# Patient Record
Sex: Male | Born: 1955
Health system: Southern US, Community
[De-identification: ages and names within clinical notes are randomized; demographics above are authoritative.]

## PROBLEM LIST (undated history)

## (undated) DIAGNOSIS — C4491 Basal cell carcinoma of skin, unspecified: Secondary | ICD-10-CM

## (undated) DIAGNOSIS — M1990 Unspecified osteoarthritis, unspecified site: Secondary | ICD-10-CM

## (undated) DIAGNOSIS — E785 Hyperlipidemia, unspecified: Secondary | ICD-10-CM

## (undated) DIAGNOSIS — I10 Essential (primary) hypertension: Secondary | ICD-10-CM

## (undated) HISTORY — DX: Basal cell carcinoma of skin, unspecified: C44.91

## (undated) HISTORY — DX: Hyperlipidemia, unspecified: E78.5

## (undated) HISTORY — DX: Essential (primary) hypertension: I10

---

## 2012-01-29 DIAGNOSIS — C4491 Basal cell carcinoma of skin, unspecified: Secondary | ICD-10-CM

## 2012-01-29 HISTORY — DX: Basal cell carcinoma of skin, unspecified: C44.91

## 2016-01-08 LAB — PSA: PSA: 0.5

## 2016-02-01 DIAGNOSIS — J309 Allergic rhinitis, unspecified: Secondary | ICD-10-CM | POA: Insufficient documentation

## 2016-02-02 LAB — HEMOGLOBIN A1C: Hemoglobin A1C: 5.4

## 2017-05-12 DIAGNOSIS — Z0000 Encounter for general adult medical examination without abnormal findings: Secondary | ICD-10-CM | POA: Diagnosis not present

## 2017-05-12 DIAGNOSIS — Z6827 Body mass index (BMI) 27.0-27.9, adult: Secondary | ICD-10-CM | POA: Diagnosis not present

## 2017-05-12 DIAGNOSIS — Z13220 Encounter for screening for lipoid disorders: Secondary | ICD-10-CM | POA: Diagnosis not present

## 2017-05-12 DIAGNOSIS — Z1159 Encounter for screening for other viral diseases: Secondary | ICD-10-CM | POA: Diagnosis not present

## 2017-05-13 LAB — HM HEPATITIS C SCREENING LAB: HM Hepatitis Screen: NEGATIVE

## 2018-05-11 DIAGNOSIS — Z0000 Encounter for general adult medical examination without abnormal findings: Secondary | ICD-10-CM | POA: Diagnosis not present

## 2018-05-11 DIAGNOSIS — Z13220 Encounter for screening for lipoid disorders: Secondary | ICD-10-CM | POA: Diagnosis not present

## 2018-05-11 LAB — HEPATIC FUNCTION PANEL
ALT: 23 (ref 10–40)
AST: 25 (ref 14–40)
Alkaline Phosphatase: 81 (ref 25–125)
Bilirubin, Total: 0.9

## 2018-05-11 LAB — BASIC METABOLIC PANEL
BUN: 16 (ref 4–21)
Creatinine: 1.1 (ref 0.6–1.3)
Glucose: 90
Potassium: 4.5 (ref 3.4–5.3)
Sodium: 143 (ref 137–147)

## 2018-05-11 LAB — LIPID PANEL
Cholesterol: 175 (ref 0–200)
HDL: 45 (ref 35–70)
LDL Cholesterol: 114
TRIGLYCERIDES: 83 (ref 40–160)

## 2018-05-18 DIAGNOSIS — Z0000 Encounter for general adult medical examination without abnormal findings: Secondary | ICD-10-CM | POA: Diagnosis not present

## 2018-05-18 DIAGNOSIS — Z6829 Body mass index (BMI) 29.0-29.9, adult: Secondary | ICD-10-CM | POA: Diagnosis not present

## 2018-07-13 DIAGNOSIS — B079 Viral wart, unspecified: Secondary | ICD-10-CM | POA: Diagnosis not present

## 2018-07-13 DIAGNOSIS — C44319 Basal cell carcinoma of skin of other parts of face: Secondary | ICD-10-CM | POA: Diagnosis not present

## 2018-07-13 DIAGNOSIS — L821 Other seborrheic keratosis: Secondary | ICD-10-CM | POA: Diagnosis not present

## 2018-07-13 DIAGNOSIS — D492 Neoplasm of unspecified behavior of bone, soft tissue, and skin: Secondary | ICD-10-CM | POA: Diagnosis not present

## 2018-07-13 DIAGNOSIS — Z85828 Personal history of other malignant neoplasm of skin: Secondary | ICD-10-CM | POA: Diagnosis not present

## 2018-08-04 DIAGNOSIS — D124 Benign neoplasm of descending colon: Secondary | ICD-10-CM | POA: Diagnosis not present

## 2018-08-04 DIAGNOSIS — Z1211 Encounter for screening for malignant neoplasm of colon: Secondary | ICD-10-CM | POA: Diagnosis not present

## 2018-08-04 DIAGNOSIS — D122 Benign neoplasm of ascending colon: Secondary | ICD-10-CM | POA: Diagnosis not present

## 2018-08-11 DIAGNOSIS — C44310 Basal cell carcinoma of skin of unspecified parts of face: Secondary | ICD-10-CM | POA: Diagnosis not present

## 2018-08-11 DIAGNOSIS — L905 Scar conditions and fibrosis of skin: Secondary | ICD-10-CM | POA: Diagnosis not present

## 2018-12-08 ENCOUNTER — Telehealth: Payer: Self-pay | Admitting: Family Medicine

## 2018-12-08 NOTE — Telephone Encounter (Signed)
Wife, [name], called to make a new patient appointment for Husband Thomas Rosario. Their son, [name].O.B- 05/31/98 is an established patient of yours.

## 2018-12-22 ENCOUNTER — Ambulatory Visit (INDEPENDENT_AMBULATORY_CARE_PROVIDER_SITE_OTHER): Payer: BLUE CROSS/BLUE SHIELD | Admitting: Family Medicine

## 2018-12-22 ENCOUNTER — Encounter: Payer: Self-pay | Admitting: Family Medicine

## 2018-12-22 VITALS — BP 131/92 | HR 85 | Ht 69.0 in | Wt 195.0 lb

## 2018-12-22 DIAGNOSIS — Z8249 Family history of ischemic heart disease and other diseases of the circulatory system: Secondary | ICD-10-CM | POA: Diagnosis not present

## 2018-12-22 DIAGNOSIS — E782 Mixed hyperlipidemia: Secondary | ICD-10-CM

## 2018-12-22 DIAGNOSIS — Z23 Encounter for immunization: Secondary | ICD-10-CM

## 2018-12-22 DIAGNOSIS — Z6828 Body mass index (BMI) 28.0-28.9, adult: Secondary | ICD-10-CM

## 2018-12-22 DIAGNOSIS — I10 Essential (primary) hypertension: Secondary | ICD-10-CM

## 2018-12-22 DIAGNOSIS — E785 Hyperlipidemia, unspecified: Secondary | ICD-10-CM | POA: Insufficient documentation

## 2018-12-22 HISTORY — DX: Essential (primary) hypertension: I10

## 2018-12-22 HISTORY — DX: Hyperlipidemia, unspecified: E78.5

## 2018-12-22 NOTE — Patient Instructions (Addendum)
Thank you for coming in today. Try to set calorie goal for 1800 calories a day.  Work on continued exercise.   This will help blood pressure.   We will get fasting labs prior to the physical.   Recombinant Zoster (Shingles) Vaccine, RZV: What You Need to Know 1. Why get vaccinated? Shingles (also called herpes zoster, or just zoster) is a painful skin rash, often with blisters. Shingles is caused by the varicella zoster virus, the same virus that causes chickenpox. After you have chickenpox, the virus stays in your body and can cause shingles later in life. You can't catch shingles from another person. However, a person who has never had chickenpox (or chickenpox vaccine) could get chickenpox from someone with shingles. A shingles rash usually appears on one side of the face or body and heals within 2 to 4 weeks. Its main symptom is pain, which can be severe. Other symptoms can include fever, headache, chills, and upset stomach. Very rarely, a shingles infection can lead to pneumonia, hearing problems, blindness, brain inflammation (encephalitis), or death. For about 1 person in 5, severe pain can continue even long after the rash has cleared up. This long-lasting pain is called post-herpetic neuralgia (PHN). Shingles is far more common in people 15 years of age and older than in younger people, and the risk increases with age. It is also more common in people whose immune system is weakened because of a disease such as cancer, or by drugs such as steroids or chemotherapy. At least 1 million people a year in the [place] get shingles. 2. Shingles vaccine (recombinant) Recombinant shingles vaccine was approved by FDA in 2017 for the prevention of shingles. In clinical trials, it was more than 90% effective in preventing shingles. It can also reduce the likelihood of PHN. Two doses, 2 to 6 months apart, are recommended for adults 79 and older. This vaccine is also recommended for people who  have already gotten the live shingles vaccine (Zostavax). There is no live virus in this vaccine. 3. Some people should not get this vaccine Tell your vaccine provider if you:  Have any severe, life-threatening allergies. A person who has ever had a life-threatening allergic reaction after a dose of recombinant shingles vaccine, or has a severe allergy to any component of this vaccine, may be advised not to be vaccinated. Ask your health care provider if you want information about vaccine components.  Are pregnant or breastfeeding. There is not much information about use of recombinant shingles vaccine in pregnant or nursing women. Your healthcare provider might recommend delaying vaccination.  Are not feeling well. If you have a mild illness, such as a cold, you can probably get the vaccine today. If you are moderately or severely ill, you should probably wait until you recover. Your doctor can advise you. 4. Risks of a vaccine reaction With any medicine, including vaccines, there is a chance of reactions. After recombinant shingles vaccination, a person might experience:  Pain, redness, soreness, or swelling at the site of the injection  Headache, muscle aches, fever, shivering, fatigue In clinical trials, most people got a sore arm with mild or moderate pain after vaccination, and some also had redness and swelling where they got the shot. Some people felt tired, had muscle pain, a headache, shivering, fever, stomach pain, or nausea. About 1 out of 6 people who got recombinant zoster vaccine experienced side effects that prevented them from doing regular activities. Symptoms went away on their own in  about 2 to 3 days. Side effects were more common in younger people. You should still get the second dose of recombinant zoster vaccine even if you had one of these reactions after the first dose. Other things that could happen after this vaccine:  People sometimes faint after medical procedures,  including vaccination. Sitting or lying down for about 15 minutes can help prevent fainting and injuries caused by a fall. Tell your provider if you feel dizzy or have vision changes or ringing in the ears.  Some people get shoulder pain that can be more severe and longer-lasting than routine soreness that can follow injections. This happens very rarely.  Any medication can cause a severe allergic reaction. Such reactions to a vaccine are estimated at about 1 in a million doses, and would happen within a few minutes to a few hours after the vaccination. As with any medicine, there is a very remote chance of a vaccine causing a serious injury or death. The safety of vaccines is always being monitored. For more information, visit: http://www.aguilar.org/ 5. What if there is a serious problem? What should I look for?  Look for anything that concerns you, such as signs of a severe allergic reaction, very high fever, or unusual behavior. Signs of a severe allergic reaction can include hives, swelling of the face and throat, difficulty breathing, a fast heartbeat, dizziness, and weakness. These would usually start a few minutes to a few hours after the vaccination. What should I do?  If you think it is a severe allergic reaction or other emergency that can't wait, call 9-1-1 or get to the nearest hospital. Otherwise, call your health care provider. Afterward, the reaction should be reported to the Vaccine Adverse Event Reporting System (VAERS). Your doctor should file this report, or you can do it yourself through the VAERS website at www.vaers.SamedayNews.es, or by calling [phone]. VAERS does not give medical advice. 6. How can I learn more?  Ask your health care provider. He or she can give you the vaccine package insert or suggest other sources of information.  Call your local or state health department.  Contact the Centers for Disease Control and Prevention (CDC): ? Call 873-691-2743  (1-800-CDC-INFO) or ? Visit CDC's vaccines website at http://hunter.com/ CDC Vaccine Information Statement Recombinant Zoster Vaccine (01/06/2017) This information is not intended to replace advice given to you by your health care provider. Make sure you discuss any questions you have with your health care provider. Document Released: 01/21/2017 Document Revised: 06/17/2018 Document Reviewed: 06/17/2018 Elsevier Interactive Patient Education  2019 [employer].

## 2018-12-22 NOTE — Progress Notes (Signed)
Thomas Rosario is a 63 y.o. male who presents to [place]: [place] today for establish care and discuss blood pressure and lipids.  Patient was previously seen at [facility] however his previous primary care provider has left the practice and is looking for a new provider.  I am the primary care provider for his son.  He denies any acute medical problems and feels pretty well.  He has a medical history significant for basal cell carcinoma on his face status post excisional biopsy in 2019.  He does not take any medications regularly.    He does exercise but does not eat a very careful diet.  He denies any fevers chills nausea vomiting or diarrhea.  His family history is concerning for sudden cardiac death in his father age 39 years old.  He has no other family history of sudden cardiac death or unexplained drowning's for example.   ROS as above: No headache, visual changes, nausea, vomiting, diarrhea, constipation, dizziness, abdominal pain, skin rash, fevers, chills, night sweats, weight loss, swollen lymph nodes, body aches, joint swelling, muscle aches, chest pain, shortness of breath, mood changes, visual or auditory hallucinations.    Exam:  BP (!) 131/92   Pulse 85   Ht 5\' 9"  (1.753 m)   Wt 195 lb (88.5 kg)   BMI 28.80 kg/m  Wt Readings from Last 5 Encounters:  12/22/18 195 lb (88.5 kg)    Gen: Well NAD HEENT: EOMI,  MMM Lungs: Normal work of breathing. CTABL Heart: RRR no MRG Abd: NABS, Soft. Nondistended, Nontender Exts: Brisk capillary refill, warm and well perfused.  Skin: no dysplastic appearing skin lesions present.  Lab and Radiology Results   Chemistry      Component Value Date/Time   NA 143 05/11/2018   K 4.5 05/11/2018   BUN 16 05/11/2018   CREATININE 1.1 05/11/2018   GLU 90 05/11/2018      Component Value Date/Time   ALKPHOS 81 05/11/2018   AST  25 05/11/2018   ALT 23 05/11/2018     Lab Results  Component Value Date   CHOL 175 05/11/2018   HDL 45 05/11/2018   LDLCALC 114 05/11/2018   TRIG 83 05/11/2018   Lab Results  Component Value Date   PSA 0.5 01/08/2016   Lab Results  Component Value Date   HGBA1C 5.4 02/02/2016     Assessment and Plan: 63 y.o. male with  Hypertension: Blood pressure elevated today.  This is his first visit and will work on lifestyle modification.  Plan to lose about 10 pounds by continuing exercise and reducing calories to about 1800 cal/day.  Additionally reducing salt intake will help as well.  Plan to recheck during his wellness visit in about 6 months or so.  At that time we will also check metabolic panel.  Hyperlipidemia with increased CVD risk.  Patient had lipids checked last year.  Based on his age and lipid profile his CVD risk is about 9%.  This is a bit elevated especially considering his father's history of sudden cardiac death in his 51s.  Plan to work on lifestyle changes as noted above and reassessment lipid panel prior to wellness visit.  Likely will be considering statins.  Administered flu vaccine today as well.  Recheck 6 months or so.  PDMP not reviewed this encounter.    Historical information moved to improve visibility of documentation.  Past Medical History:  Diagnosis Date  .  BCC (basal cell carcinoma of skin) 01/29/2012   History reviewed. No pertinent surgical history. Social History   Tobacco Use  . Smoking status: Never Smoker  . Smokeless tobacco: Never Used  Substance Use Topics  . Alcohol use: Never    Frequency: Never   family history includes Early death in his father; Heart attack in his father.  Medications: No current outpatient medications on file.   No current facility-administered medications for this visit.    No Known Allergies   Discussed warning signs or symptoms. Please see discharge instructions. Patient expresses understanding.

## 2019-01-05 ENCOUNTER — Ambulatory Visit (INDEPENDENT_AMBULATORY_CARE_PROVIDER_SITE_OTHER): Payer: BLUE CROSS/BLUE SHIELD

## 2019-01-05 ENCOUNTER — Ambulatory Visit (INDEPENDENT_AMBULATORY_CARE_PROVIDER_SITE_OTHER): Payer: BLUE CROSS/BLUE SHIELD | Admitting: Family Medicine

## 2019-01-05 ENCOUNTER — Encounter: Payer: Self-pay | Admitting: Family Medicine

## 2019-01-05 VITALS — BP 139/97 | HR 75 | Temp 97.9°F | Ht 69.0 in | Wt 197.0 lb

## 2019-01-05 DIAGNOSIS — R05 Cough: Secondary | ICD-10-CM

## 2019-01-05 DIAGNOSIS — R0989 Other specified symptoms and signs involving the circulatory and respiratory systems: Secondary | ICD-10-CM

## 2019-01-05 DIAGNOSIS — R0982 Postnasal drip: Secondary | ICD-10-CM | POA: Insufficient documentation

## 2019-01-05 DIAGNOSIS — R059 Cough, unspecified: Secondary | ICD-10-CM

## 2019-01-05 MED ORDER — AZELASTINE HCL 0.1 % NA SOLN: 1.0000 | Freq: Two times a day (BID) | NASAL | 12 refills | Status: DC

## 2019-01-05 MED ORDER — PREDNISONE 10 MG PO TABS: 30.0000 mg | ORAL_TABLET | Freq: Every day | ORAL | 0 refills | Status: DC

## 2019-01-05 MED ORDER — BENZONATATE 200 MG PO CAPS: 200.0000 mg | ORAL_CAPSULE | Freq: Three times a day (TID) | ORAL | 0 refills | Status: DC | PRN

## 2019-01-05 NOTE — Patient Instructions (Addendum)
Thank you for coming in today. Get xray today on the way out.  Take prednisone daily for 5 days.  Use tessalon pearles as needed for cough.  Use the atelin nasal spray for post nasal drainage.   Let me know if not doing well.    Postnasal Drip Postnasal drip is the feeling of mucus going down the back of your throat. Mucus is a slimy substance that moistens and cleans your nose and throat, as well as the air pockets in face bones near your forehead and cheeks (sinuses). Small amounts of mucus pass from your nose and sinuses down the back of your throat all the time. This is normal. When you produce too much mucus or the mucus gets too thick, you can feel it. Some common causes of postnasal drip include:  Having more mucus because of: ? A cold or the flu. ? Allergies. ? Cold air. ? Certain medicines.  Having more mucus that is thicker because of: ? A sinus or nasal infection. ? Dry air. ? A food allergy. Follow these instructions at home: Relieving discomfort   Gargle with a salt-water mixture 3-4 times a day or as needed. To make a salt-water mixture, completely dissolve -1 tsp of salt in 1 cup of warm water.  If the air in your home is dry, use a humidifier to add moisture to the air.  Use a saline spray or container (neti pot) to flush out the nose (nasal irrigation). These methods can help clear away mucus and keep the nasal passages moist. General instructions  Take over-the-counter and prescription medicines only as told by your health care provider.  Follow instructions from your health care provider about eating or drinking restrictions. You may need to avoid caffeine.  Avoid things that you know you are allergic to (allergens), like dust, mold, pollen, pets, or certain foods.  Drink enough fluid to keep your urine pale yellow.  Keep all follow-up visits as told by your health care provider. This is important. Contact a health care provider if:  You have a  fever.  You have a sore throat.  You have difficulty swallowing.  You have headache.  You have sinus pain.  You have a cough that does not go away.  The mucus from your nose becomes thick and is green or yellow in color.  You have cold or flu symptoms that last more than 10 days. Summary  Postnasal drip is the feeling of mucus going down the back of your throat.  If your health care provider approves, use nasal irrigation or a nasal spray 2?4 times a day.  Avoid things that you know you are allergic to (allergens), like dust, mold, pollen, pets, or certain foods. This information is not intended to replace advice given to you by your health care provider. Make sure you discuss any questions you have with your health care provider. Document Released: 02/24/2017 Document Revised: 02/24/2017 Document Reviewed: 02/24/2017 Elsevier Interactive Patient Education  2019 Elsevier Inc.    Cough, Adult  A cough helps to clear your throat and lungs. A cough may last only 2-3 weeks (acute), or it may last longer than 8 weeks (chronic). Many different things can cause a cough. A cough may be a sign of an illness or another medical condition. Follow these instructions at home:  Pay attention to any changes in your cough.  Take medicines only as told by your doctor. ? If you were prescribed an antibiotic medicine, take it as  told by your doctor. Do not stop taking it even if you start to feel better. ? Talk with your doctor before you try using a cough medicine.  Drink enough fluid to keep your pee (urine) clear or pale yellow.  If the air is dry, use a cold steam vaporizer or humidifier in your home.  Stay away from things that make you cough at work or at home.  If your cough is worse at night, try using extra pillows to raise your head up higher while you sleep.  Do not smoke, and try not to be around smoke. If you need help quitting, ask your doctor.  Do not have caffeine.  Do  not drink alcohol.  Rest as needed. Contact a doctor if:  You have new problems (symptoms).  You cough up yellow fluid (pus).  Your cough does not get better after 2-3 weeks, or your cough gets worse.  Medicine does not help your cough and you are not sleeping well.  You have pain that gets worse or pain that is not helped with medicine.  You have a fever.  You are losing weight and you do not know why.  You have night sweats. Get help right away if:  You cough up blood.  You have trouble breathing.  Your heartbeat is very fast. This information is not intended to replace advice given to you by your health care provider. Make sure you discuss any questions you have with your health care provider. Document Released: 07/25/2011 Document Revised: 04/18/2016 Document Reviewed: 01/18/2015 Elsevier Interactive Patient Education  2019 [employer].

## 2019-01-05 NOTE — Progress Notes (Signed)
Thomas Rosario is a 63 y.o. male who presents to [place]: [place] today for cough congestion and runny nose.  Symptoms present for about a month.  Patient notes waxing and waning cough and congestion for a month.  He denies fevers chills vomiting significant shortness of breath or wheezing.  He is tried some over-the-counter medications which help a little.  His wife has been sick with similar illness.  Additionally he notes a persistent postnasal drainage.  This is been ongoing for years and has failed to improve.  He is tried various antihistamines which he does not tolerate very well.  Additionally he is tried Flonase nasal spray which he did not like either.  He notes this is mildly bothersome.   ROS as above:  Exam:  BP (!) 139/97   Pulse 75   Temp 97.9 F (36.6 C) (Oral)   Ht 5\' 9"  (1.753 m)   Wt 197 lb (89.4 kg)   SpO2 97%   BMI 29.09 kg/m  Wt Readings from Last 5 Encounters:  01/05/19 197 lb (89.4 kg)  12/22/18 195 lb (88.5 kg)    Gen: Well NAD HEENT: EOMI,  MMM inflamed nasal turbinates.  Posterior pharynx with cobblestoning.  Mild cervical lymphadenopathy Lungs: Normal work of breathing. CTABL Heart: RRR no MRG Abd: NABS, Soft. Nondistended, Nontender Exts: Brisk capillary refill, warm and well perfused.   Lab and Radiology Results Dg Chest 2 View  Result Date: 01/05/2019 CLINICAL DATA:  Cough and congestion. EXAM: CHEST - 2 VIEW COMPARISON:  None. FINDINGS: The heart size and mediastinal contours are within normal limits. There is no evidence of pulmonary edema, consolidation, pneumothorax, nodule or pleural fluid. The visualized skeletal structures are unremarkable. IMPRESSION: No active cardiopulmonary disease. Electronically Signed   By: Aletta Edouard M.D.   On: 01/05/2019 17:28   I personally (independently) visualized and performed the  interpretation of the images attached in this note.     Assessment and Plan: 63 y.o. male with  Cough.  Patient has a 4-week history of cough and congestion.  This seems to be waxing and waning and is likely post viral in nature.  Chest x-ray does not show active pneumonia or other concerning findings.  Plan to treat with short course of prednisone and Tessalon Perles.  Additionally patient has chronic postnasal drainage.  He has had trials of oral antihistamine and nasal steroid sprays which he is not tolerated very well.  Reasonable for trial of Astelin nasal spray.  This also may help his cough.  Recheck if not improving.  PDMP not reviewed this encounter. Orders Placed This Encounter  Procedures  . DG Chest 2 View    Order Specific Question:   Reason for exam:    Answer:   Cough, assess intra-thoracic pathology    Order Specific Question:   Preferred imaging location?    Answer:   [facility]   Meds ordered this encounter  Medications  . azelastine (ASTELIN) 0.1 % nasal spray    Sig: Place 1 spray into both nostrils 2 (two) times daily. Use in each nostril as directed    Dispense:  30 mL    Refill:  12  . predniSONE (DELTASONE) 10 MG tablet    Sig: Take 3 tablets (30 mg total) by mouth daily with breakfast.    Dispense:  15 tablet    Refill:  0  . benzonatate (TESSALON) 200 MG capsule  Sig: Take 1 capsule (200 mg total) by mouth 3 (three) times daily as needed for cough.    Dispense:  45 capsule    Refill:  0     Historical information moved to improve visibility of documentation.  Past Medical History:  Diagnosis Date  . BCC (basal cell carcinoma of skin) 01/29/2012  . HLD (hyperlipidemia) 12/22/2018  . HTN (hypertension) 12/22/2018   No past surgical history on file. Social History   Tobacco Use  . Smoking status: Never Smoker  . Smokeless tobacco: Never Used  Substance Use Topics  . Alcohol use: Never    Frequency: Never   family history includes  Early death in his father; Heart attack in his father.  Medications: Current Outpatient Medications  Medication Sig Dispense Refill  . azelastine (ASTELIN) 0.1 % nasal spray Place 1 spray into both nostrils 2 (two) times daily. Use in each nostril as directed 30 mL 12  . benzonatate (TESSALON) 200 MG capsule Take 1 capsule (200 mg total) by mouth 3 (three) times daily as needed for cough. 45 capsule 0  . predniSONE (DELTASONE) 10 MG tablet Take 3 tablets (30 mg total) by mouth daily with breakfast. 15 tablet 0   No current facility-administered medications for this visit.    No Known Allergies   Discussed warning signs or symptoms. Please see discharge instructions. Patient expresses understanding.

## 2019-06-22 ENCOUNTER — Encounter: Payer: BLUE CROSS/BLUE SHIELD | Admitting: Family Medicine

## 2019-08-26 DIAGNOSIS — S299XXA Unspecified injury of thorax, initial encounter: Secondary | ICD-10-CM | POA: Diagnosis not present

## 2019-08-26 DIAGNOSIS — R109 Unspecified abdominal pain: Secondary | ICD-10-CM | POA: Diagnosis not present

## 2019-08-26 DIAGNOSIS — S0990XA Unspecified injury of head, initial encounter: Secondary | ICD-10-CM | POA: Diagnosis not present

## 2019-08-26 DIAGNOSIS — R072 Precordial pain: Secondary | ICD-10-CM | POA: Diagnosis not present

## 2019-08-26 DIAGNOSIS — S30811A Abrasion of abdominal wall, initial encounter: Secondary | ICD-10-CM | POA: Diagnosis not present

## 2019-08-26 DIAGNOSIS — S3991XA Unspecified injury of abdomen, initial encounter: Secondary | ICD-10-CM | POA: Diagnosis not present

## 2019-08-26 DIAGNOSIS — T1490XA Injury, unspecified, initial encounter: Secondary | ICD-10-CM | POA: Diagnosis not present

## 2019-08-26 DIAGNOSIS — S0081XA Abrasion of other part of head, initial encounter: Secondary | ICD-10-CM | POA: Diagnosis not present

## 2019-08-26 DIAGNOSIS — S199XXA Unspecified injury of neck, initial encounter: Secondary | ICD-10-CM | POA: Diagnosis not present

## 2019-11-24 DIAGNOSIS — Z23 Encounter for immunization: Secondary | ICD-10-CM | POA: Diagnosis not present

## 2020-01-11 ENCOUNTER — Telehealth: Payer: Self-pay | Admitting: Family Medicine

## 2020-01-11 NOTE — Telephone Encounter (Signed)
Caller Name: Brycin Carraway, wife (existing pt of Dr. Lorelei Pont) Phone: [phone]  Pt wife called in requesting new pt appointment with Dr. Lorelei Pont if she will accept him. (she is a current pt)  Please advise.

## 2020-01-12 NOTE — Telephone Encounter (Signed)
Ok to schedule patient for new patient appointment.  

## 2020-01-28 DIAGNOSIS — Z23 Encounter for immunization: Secondary | ICD-10-CM | POA: Diagnosis not present

## 2020-02-09 ENCOUNTER — Other Ambulatory Visit: Payer: Self-pay

## 2020-02-09 NOTE — Progress Notes (Addendum)
[place] at [facility] [place], [place], [place] [address] [phone]  Date:  02/10/2020   Name:  Thomas Rosario   DOB:  [birth year]   MRN:  [id number]  PCP:  Darreld Mclean, MD    Chief Complaint: New Patient (Initial Visit) (back pain, left lower radiates to leg, 6 month, no known injury)   History of Present Illness:  Thomas Rosario is a 64 y.o. very pleasant male patient who presents with the following:  New patient here today to establish care History of hyperlipidemia, hypertension, basal cell carcinoma of skin I take care of his wife Tito Dine and he requested to see me as well He notes that he is generally in good health  He has noted some ?sciatica for 6 months or so.   He has had lower back pains in the past but has not bothered him in recent years The pain is in his lower left back, the left buttock and into the left lateral leg- will come and go He is not aware of any injury or back strain He notes that he is less active than he would like to be.  Riding his bike is fine, but jogging and tennis causes more pain.  He recently went skiing and did ok during a long day of skiing however The pain is there at a low level all the time, but sometimes is more painful No weakness or numbness in his leg, no bowel or bladder control trouble He has tried advil, Tori Milks- sometimes helps a bit  He is a Armed forces operational officer- Office manager  He has 2 children, and Tito Dine has 2 children as well, for a total.  They have 1 grandson who is almost 54 months old  Colon cancer screening is up-to-date Covid vaccine; he got one dose so far Shingles vaccine;  We discussed doing this later once covid series is complete Due for routine labs today Dermatology exam- he does go for regular check ups with her derm in [place]   His father died when pt was 64 yo of an apparent MI.  He was in his 30s.  Pt has never done a stress test or  had any heart disease  He does generally like to exercise but his current lower back pain is holding him back. However no issues with CP or SOB with exercise    Patient Active Problem List   Diagnosis Date Noted  . Postnasal drip 01/05/2019  . HTN (hypertension) 12/22/2018  . HLD (hyperlipidemia) 12/22/2018  . Family history of heart attack 12/22/2018  . Allergic rhinitis 02/01/2016  . BCC (basal cell carcinoma of skin) 01/29/2012    Past Medical History:  Diagnosis Date  . BCC (basal cell carcinoma of skin) 01/29/2012  . HLD (hyperlipidemia) 12/22/2018  . HTN (hypertension) 12/22/2018    History reviewed. No pertinent surgical history.  Social History   Tobacco Use  . Smoking status: Never Smoker  . Smokeless tobacco: Never Used  Substance Use Topics  . Alcohol use: Yes  . Drug use: Never    Family History  Problem Relation Age of Onset  . Early death Father   . Heart attack Father     No Known Allergies  Medication list has been reviewed and updated.  No current outpatient medications on file prior to visit.   No current facility-administered medications on file prior to visit.    Review of  Systems:  As per HPI- otherwise negative.   Physical Examination: Vitals:   02/10/20 0932  BP: 124/87  Pulse: 80  Resp: 16  Temp: (!) 97.1 F (36.2 C)  SpO2: 96%   Vitals:   02/10/20 0932  Weight: 195 lb (88.5 kg)  Height: 5\' 9"  (1.753 m)   Body mass index is 28.8 kg/m. Ideal Body Weight: Weight in (lb) to have BMI = 25: 168.9  GEN: no acute distress.  Mild overweight, looks well  HEENT: Atraumatic, Normocephalic.  Ears and Nose: No external deformity. CV: RRR, No M/G/R. No JVD. No thrill. No extra heart sounds. PULM: CTA B, no wheezes, crackles, rhonchi. No retractions. No resp. distress. No accessory muscle use. ABD: S, NT, ND, +BS. No rebound. No HSM. EXTR: No c/c/e PSYCH: Normally interactive. Conversant.  He notes discomfort with firm palpation  over the left sciatic notch, mildly positive SLR left only Normal strength, sensation and DTR of both LE  Normal thoracolumbar range of motion  EKG:  NSR, no old tracing for comparison Assessment and Plan: Encounter for medical examination to establish care  Mixed hyperlipidemia - Plan: Lipid panel  Essential hypertension - Plan: CBC, Comprehensive metabolic panel  BMI 0000000  Screening for prostate cancer - Plan: PSA  Screening for diabetes mellitus - Plan: Hemoglobin A1c  Chronic left-sided low back pain with left-sided sciatica - Plan: DG Lumbar Spine Complete, predniSONE (DELTASONE) 20 MG tablet, methocarbamol (ROBAXIN) 500 MG tablet  Family history of early CAD - Plan: EKG 12-Lead  Here today as a new patient to establish care. His main concern today is left-sided sciatica present for about 6 months.  Will obtain plain films of his lumbar spine today.  Also will try treating him with prednisone and as needed Robaxin.  I have asked him to let me know how this works for him  He has an unusual family history of early coronary artery disease, his father apparently dying of MI in his 26s.  This history is a bit uncertain as the patient was very young at the time the patient does have 3 older brothers who are healthy Advised I would like to do a noninvasive stress test-treadmill-once his back is feeling better.  He is in agreement  Routine screening labs pending as above- Will plan further follow- up pending labs.  This visit occurred during the SARS-CoV-2 public health emergency.  Safety protocols were in place, including screening questions prior to the visit, additional usage of staff PPE, and extensive cleaning of exam room while observing appropriate contact time as indicated for disinfecting solutions.    Signed Lamar Blinks, MD  Received his labs and x-ray reports, letter to patient Suggest a statin  Results for orders placed or performed in visit on 02/10/20   CBC  Result Value Ref Range   WBC 4.3 4.0 - 10.5 K/uL   RBC 4.97 4.22 - 5.81 Mil/uL   Platelets 263.0 150.0 - 400.0 K/uL   Hemoglobin 15.7 13.0 - 17.0 g/dL   HCT 45.4 39.0 - 52.0 %   MCV 91.4 78.0 - 100.0 fl   MCHC 34.5 30.0 - 36.0 g/dL   RDW 13.1 11.5 - 15.5 %  Comprehensive metabolic panel  Result Value Ref Range   Sodium 139 135 - 145 mEq/L   Potassium 4.2 3.5 - 5.1 mEq/L   Chloride 105 96 - 112 mEq/L   CO2 31 19 - 32 mEq/L   Glucose, Bld 93 70 - 99 mg/dL   BUN  19 6 - 23 mg/dL   Creatinine, Ser 1.05 0.40 - 1.50 mg/dL   Total Bilirubin 0.5 0.2 - 1.2 mg/dL   Alkaline Phosphatase 75 39 - 117 U/L   AST 16 0 - 37 U/L   ALT 21 0 - 53 U/L   Total Protein 6.3 6.0 - 8.3 g/dL   Albumin 4.0 3.5 - 5.2 g/dL   GFR 71.37 >60.00 mL/min   Calcium 9.2 8.4 - 10.5 mg/dL  Hemoglobin A1c  Result Value Ref Range   Hgb A1c MFr Bld 5.4 4.6 - 6.5 %  Lipid panel  Result Value Ref Range   Cholesterol 186 0 - 200 mg/dL   Triglycerides 65.0 0.0 - 149.0 mg/dL   HDL 37.30 (L) >39.00 mg/dL   VLDL 13.0 0.0 - 40.0 mg/dL   LDL Cholesterol 135 (H) 0 - 99 mg/dL   Total CHOL/HDL Ratio 5    NonHDL 148.28   PSA  Result Value Ref Range   PSA 0.32 0.10 - 4.00 ng/mL   DG Lumbar Spine Complete  Result Date: 02/10/2020 CLINICAL DATA:  Chronic left-sided low back and left leg pain. EXAM: LUMBAR SPINE - COMPLETE 4+ VIEW COMPARISON:  None. FINDINGS: No fracture or spondylolisthesis is noted. Moderate degenerative disc disease is noted at L2-3, L3-4 and L4-5. Atherosclerosis of abdominal aorta is noted. IMPRESSION: Moderate multilevel degenerative disc disease. No acute abnormality seen in the lumbar spine. Aortic Atherosclerosis (ICD10-I70.0). Electronically Signed   By: Marijo Conception M.D.   On: 02/10/2020 14:00

## 2020-02-09 NOTE — Patient Instructions (Addendum)
It was good to see you today!  I will be in touch with your labs as soon as possible Please go to lab and then to x-ray on the ground floor  I would recommend that you get your shingles vaccine series at your convenience- can be given at pharmacy if you like  We are going to treat your sciatica symptoms with a course of prednisone and also as needed robaxin (muscle relaxer). Avoid using aleve/ ibuprofen while on prednisone as this combo is hard on your stomach Please keep me posted about your back- once you are better I would like to get you a treadmill test to look at your heart in more detail

## 2020-02-10 ENCOUNTER — Other Ambulatory Visit: Payer: Self-pay

## 2020-02-10 ENCOUNTER — Ambulatory Visit (HOSPITAL_BASED_OUTPATIENT_CLINIC_OR_DEPARTMENT_OTHER)
Admission: RE | Admit: 2020-02-10 | Discharge: 2020-02-10 | Disposition: A | Discharge status: Home / Self Care | Payer: BC Managed Care – PPO | Source: Ambulatory Visit | Attending: Family Medicine | Admitting: Family Medicine

## 2020-02-10 ENCOUNTER — Encounter: Payer: Self-pay | Admitting: Family Medicine

## 2020-02-10 ENCOUNTER — Ambulatory Visit (INDEPENDENT_AMBULATORY_CARE_PROVIDER_SITE_OTHER): Payer: BC Managed Care – PPO | Admitting: Family Medicine

## 2020-02-10 VITALS — BP 124/87 | HR 80 | Temp 97.1°F | Resp 16 | Ht 69.0 in | Wt 195.0 lb

## 2020-02-10 DIAGNOSIS — Z6828 Body mass index (BMI) 28.0-28.9, adult: Secondary | ICD-10-CM | POA: Diagnosis not present

## 2020-02-10 DIAGNOSIS — G8929 Other chronic pain: Secondary | ICD-10-CM

## 2020-02-10 DIAGNOSIS — M5442 Lumbago with sciatica, left side: Secondary | ICD-10-CM | POA: Insufficient documentation

## 2020-02-10 DIAGNOSIS — I10 Essential (primary) hypertension: Secondary | ICD-10-CM | POA: Diagnosis not present

## 2020-02-10 DIAGNOSIS — Z125 Encounter for screening for malignant neoplasm of prostate: Secondary | ICD-10-CM

## 2020-02-10 DIAGNOSIS — M5136 Other intervertebral disc degeneration, lumbar region: Secondary | ICD-10-CM | POA: Diagnosis not present

## 2020-02-10 DIAGNOSIS — Z131 Encounter for screening for diabetes mellitus: Secondary | ICD-10-CM | POA: Diagnosis not present

## 2020-02-10 DIAGNOSIS — Z8249 Family history of ischemic heart disease and other diseases of the circulatory system: Secondary | ICD-10-CM | POA: Diagnosis not present

## 2020-02-10 DIAGNOSIS — Z0000 Encounter for general adult medical examination without abnormal findings: Secondary | ICD-10-CM

## 2020-02-10 DIAGNOSIS — E782 Mixed hyperlipidemia: Secondary | ICD-10-CM

## 2020-02-10 LAB — COMPREHENSIVE METABOLIC PANEL
ALT: 21 U/L (ref 0–53)
AST: 16 U/L (ref 0–37)
Albumin: 4 g/dL (ref 3.5–5.2)
Alkaline Phosphatase: 75 U/L (ref 39–117)
BUN: 19 mg/dL (ref 6–23)
CO2: 31 mEq/L (ref 19–32)
Calcium: 9.2 mg/dL (ref 8.4–10.5)
Chloride: 105 mEq/L (ref 96–112)
Creatinine, Ser: 1.05 mg/dL (ref 0.40–1.50)
GFR: 71.37 mL/min (ref >60.00)
Glucose, Bld: 93 mg/dL (ref 70–99)
Potassium: 4.2 mEq/L (ref 3.5–5.1)
Sodium: 139 mEq/L (ref 135–145)
Total Bilirubin: 0.5 mg/dL (ref 0.2–1.2)
Total Protein: 6.3 g/dL (ref 6.0–8.3)

## 2020-02-10 LAB — LIPID PANEL
Cholesterol: 186 mg/dL (ref 0–200)
HDL: 37.3 mg/dL — ABNORMAL LOW (ref >39.00)
LDL Cholesterol: 135 mg/dL — ABNORMAL HIGH (ref 0–99)
NonHDL: 148.28
Total CHOL/HDL Ratio: 5
Triglycerides: 65 mg/dL (ref 0.0–149.0)
VLDL: 13 mg/dL (ref 0.0–40.0)

## 2020-02-10 LAB — PSA: PSA: 0.32 ng/mL (ref 0.10–4.00)

## 2020-02-10 LAB — CBC
HCT: 45.4 % (ref 39.0–52.0)
Hemoglobin: 15.7 g/dL (ref 13.0–17.0)
MCHC: 34.5 g/dL (ref 30.0–36.0)
MCV: 91.4 fl (ref 78.0–100.0)
Platelets: 263 10*3/uL (ref 150.0–400.0)
RBC: 4.97 Mil/uL (ref 4.22–5.81)
RDW: 13.1 % (ref 11.5–15.5)
WBC: 4.3 10*3/uL (ref 4.0–10.5)

## 2020-02-10 LAB — HEMOGLOBIN A1C: Hgb A1c MFr Bld: 5.4 % (ref 4.6–6.5)

## 2020-02-10 MED ORDER — METHOCARBAMOL 500 MG PO TABS: 500.0000 mg | ORAL_TABLET | Freq: Three times a day (TID) | ORAL | 0 refills | Status: DC | PRN

## 2020-02-10 MED ORDER — PREDNISONE 20 MG PO TABS: ORAL_TABLET | ORAL | 0 refills | Status: DC

## 2020-02-17 NOTE — Telephone Encounter (Signed)
error 

## 2020-02-25 DIAGNOSIS — Z23 Encounter for immunization: Secondary | ICD-10-CM | POA: Diagnosis not present

## 2020-08-10 ENCOUNTER — Other Ambulatory Visit: Payer: Self-pay

## 2020-08-10 ENCOUNTER — Ambulatory Visit (INDEPENDENT_AMBULATORY_CARE_PROVIDER_SITE_OTHER): Payer: BC Managed Care – PPO

## 2020-08-10 DIAGNOSIS — Z23 Encounter for immunization: Secondary | ICD-10-CM

## 2020-10-10 ENCOUNTER — Ambulatory Visit: Payer: BC Managed Care – PPO

## 2020-10-11 ENCOUNTER — Other Ambulatory Visit: Payer: Self-pay

## 2020-10-11 ENCOUNTER — Ambulatory Visit (INDEPENDENT_AMBULATORY_CARE_PROVIDER_SITE_OTHER): Payer: BC Managed Care – PPO

## 2020-10-11 DIAGNOSIS — Z23 Encounter for immunization: Secondary | ICD-10-CM

## 2020-10-11 NOTE — Progress Notes (Signed)
Pt is here today for shingrix vaccine. Pt was given shingrix vaccine in left deltoid. Pt tolerated well.

## 2020-12-24 NOTE — Progress Notes (Addendum)
[place] at [facility] [place], [place], [place] [address] [phone]  Date:  12/28/2020   Name:  Thomas Rosario   DOB:  1956/02/06   MRN:  [id number]  PCP:  Darreld Mclean, MD    Chief Complaint: Hand Pain (Left ring finger, several months, swelling, no known injury/)   History of Present Illness:  Thomas Rosario is a 65 y.o. very pleasant male patient who presents with the following:  Patient today with concern of pain in his finger History of hypertension, hyperlipidemia, skin cancer Last seen by myself in March I also take care of his wife Tito Dine He is a Armed forces operational officer- Office manager  He has 2 children, and Tito Dine has 2 children as well, for a total.  They have 1 granddaughter who is 1-year-old He does have unusual family history of early coronary disease, we discussed doing a treadmill test previously.  We will ask patient if he feels up to this-we had delayed at last visit due to back pain  COVID vaccine- he had 2 doses, no booster yet- encouraged him to do this.  He did have covid last month but it was mild  Flu vaccine- do today  Routine labs done in March, can update today if he would like- will update oday He would also like to get the treadmill as his back is doing better  He has developed pain in his left ring finger.   He had noted some difficulty getting his wedding band off and on for the last 9 months or so He had to stop wearing his ring- he bought a larger size He is having pain and swelling at the PIP joint, it is tender to touch and feels "like it burns" He recently tried to play golf and it was very painful No recent or prior injury to this finger He is not having any other major joint issues   Patient Active Problem List   Diagnosis Date Noted   Postnasal drip 01/05/2019   HTN (hypertension) 12/22/2018   HLD (hyperlipidemia) 12/22/2018   Family history of heart attack 12/22/2018    Allergic rhinitis 02/01/2016   BCC (basal cell carcinoma of skin) 01/29/2012    Past Medical History:  Diagnosis Date   BCC (basal cell carcinoma of skin) 01/29/2012   HLD (hyperlipidemia) 12/22/2018   HTN (hypertension) 12/22/2018    History reviewed. No pertinent surgical history.  Social History   Tobacco Use   Smoking status: Never Smoker   Smokeless tobacco: Never Used  Vaping Use   Vaping Use: Never used  Substance Use Topics   Alcohol use: Yes   Drug use: Never    Family History  Problem Relation Age of Onset   Early death Father    Heart attack Father     No Known Allergies  Medication list has been reviewed and updated.  No current outpatient medications on file prior to visit.   No current facility-administered medications on file prior to visit.    Review of Systems:  As per HPI- otherwise negative.   Physical Examination: Vitals:   12/28/20 0919  BP: 124/80  Pulse: 83  Resp: 17  SpO2: 99%   Vitals:   12/28/20 0919  Weight: 201 lb (91.2 kg)  Height: 5\' 9"  (1.753 m)   Body mass index is 29.68 kg/m. Ideal Body Weight: Weight in (lb) to have BMI = 25: 168.9  GEN: no acute  distress.  Mild overweight, looks well  HEENT: Atraumatic, Normocephalic.  Ears and Nose: No external deformity. CV: RRR, No M/G/R. No JVD. No thrill. No extra heart sounds. PULM: CTA B, no wheezes, crackles, rhonchi. No retractions. No resp. distress. No accessory muscle use. ABD: S, NT, ND, +BS. No rebound. No HSM. EXTR: No c/c/e PSYCH: Normally interactive. Conversant.  Left hand: he has hypertrophy several joints esp the ring finger PIP joint.  Not stiff or hot, no redness.  Mild tenderness of joint    Assessment and Plan: Finger pain, left - Plan: DG Hand Complete Left, CBC, Ambulatory referral to Hand Surgery, Sedimentation rate, Uric acid, Rheumatoid Factor  Mixed hyperlipidemia - Plan: Lipid panel  Essential hypertension - Plan: CBC  Screening for  prostate cancer - Plan: PSA  Screening for diabetes mellitus - Plan: Comprehensive metabolic panel, Hemoglobin A1c  Family history of early CAD - Plan: Exercise Tolerance Test, Cardiac Stress Test: Informed Consent Details: Physician/Practitioner Attestation; Transcribe to consent form and obtain patient signature  Immunization due - Plan: Flu Vaccine QUAD 6+ mos PF IM (Fluarix Quad PF)  Pt has likely OA of this left ringer finger PIP joint.  However, it is limiting his activities- he cannot play golf as he enjoys doing.  Will work up today and refer to see ortho as well.  He has already tried voltaren gel and OTC pain relievers Other routine labs as above Ordered ETT for family history  of early CAD.  Pt does not have CP with exercise Flu vaccine given Encourage covid booster  This visit occurred during the SARS-CoV-2 public health emergency.  Safety protocols were in place, including screening questions prior to the visit, additional usage of staff PPE, and extensive cleaning of exam room while observing appropriate contact time as indicated for disinfecting solutions.    Signed Lamar Blinks, MD Received his lab results and x-ray report as below, message to patient  Results for orders placed or performed in visit on 12/28/20  CBC  Result Value Ref Range   WBC 5.0 4.0 - 10.5 K/uL   RBC 4.98 4.22 - 5.81 Mil/uL   Platelets 251.0 150.0 - 400.0 K/uL   Hemoglobin 15.7 13.0 - 17.0 g/dL   HCT 45.9 39.0 - 52.0 %   MCV 92.3 78.0 - 100.0 fl   MCHC 34.1 30.0 - 36.0 g/dL   RDW 13.3 11.5 - 15.5 %  Comprehensive metabolic panel  Result Value Ref Range   Sodium 141 135 - 145 mEq/L   Potassium 4.3 3.5 - 5.1 mEq/L   Chloride 105 96 - 112 mEq/L   CO2 32 19 - 32 mEq/L   Glucose, Bld 86 70 - 99 mg/dL   BUN 13 6 - 23 mg/dL   Creatinine, Ser 1.05 0.40 - 1.50 mg/dL   Total Bilirubin 0.8 0.2 - 1.2 mg/dL   Alkaline Phosphatase 74 39 - 117 U/L   AST 16 0 - 37 U/L   ALT 22 0 - 53 U/L   Total  Protein 6.3 6.0 - 8.3 g/dL   Albumin 4.1 3.5 - 5.2 g/dL   GFR 75.41 >60.00 mL/min   Calcium 9.3 8.4 - 10.5 mg/dL  Hemoglobin A1c  Result Value Ref Range   Hgb A1c MFr Bld 5.5 4.6 - 6.5 %  Lipid panel  Result Value Ref Range   Cholesterol 166 0 - 200 mg/dL   Triglycerides 92.0 0.0 - 149.0 mg/dL   HDL 39.50 >39.00 mg/dL   VLDL 18.4 0.0 -  40.0 mg/dL   LDL Cholesterol 108 (H) 0 - 99 mg/dL   Total CHOL/HDL Ratio 4    NonHDL 126.38   PSA  Result Value Ref Range   PSA 0.32 0.10 - 4.00 ng/mL  Sedimentation rate  Result Value Ref Range   Sed Rate 1 0 - 20 mm/hr  Uric acid  Result Value Ref Range   Uric Acid, Serum 6.0 4.0 - 7.8 mg/dL   DG Hand Complete Left  Result Date: 12/28/2020 CLINICAL DATA:  Left ring finger pain EXAM: LEFT HAND - COMPLETE 3+ VIEW COMPARISON:  None. FINDINGS: No acute fracture or dislocation. Mild degenerative changes most pronounced at the first MCP and first CMC joints with joint space narrowing and marginal osteophyte formation. Radiocarpal joint space narrowing. Subchondral cysts versus erosions at the radial styloid tip and adjacent scaphoid. Suspect marginal erosion along the ulnar aspect of the ring finger proximal phalanx adjacent to the PIP joint. No abnormal soft tissue mineralization. No focal soft tissue swelling. IMPRESSION: Mild degenerative changes of the left hand. Subchondral cysts versus erosions at the radial styloid tip and adjacent scaphoid. Suspect marginal erosion of the ring finger PIP joint. Findings raise suspicion of a crystalline or inflammatory arthropathy including rheumatoid arthritis. Electronically Signed   By: Davina Poke D.O.   On: 12/28/2020 14:48    The 10-year ASCVD risk score Mikey Bussing DC Brooke Bonito., et al., 2013) is: 10.4%   Values used to calculate the score:     Age: 89 years     Sex: Male     Is Non-Hispanic African American: No     Diabetic: No     Tobacco smoker: No     Systolic Blood Pressure: 384 mmHg     Is BP treated: No      HDL Cholesterol: 39.5 mg/dL     Total Cholesterol: 166 mg/dL

## 2020-12-25 ENCOUNTER — Ambulatory Visit: Payer: BC Managed Care – PPO | Admitting: Family Medicine

## 2020-12-27 NOTE — Patient Instructions (Incomplete)
It was good to see you again today - I will be in touch with your labs and x-ray asap I suspect that you have osteoarthritis in your finger, but we will look for any evidence of gout or rheumatoid arthritis I will set you up for a stress test to check on your heart, and we will also refer you to see hand surgery to discuss your finger pain

## 2020-12-28 ENCOUNTER — Encounter: Payer: Self-pay | Admitting: Family Medicine

## 2020-12-28 ENCOUNTER — Other Ambulatory Visit: Payer: Self-pay

## 2020-12-28 ENCOUNTER — Ambulatory Visit (HOSPITAL_BASED_OUTPATIENT_CLINIC_OR_DEPARTMENT_OTHER)
Admission: RE | Admit: 2020-12-28 | Discharge: 2020-12-28 | Disposition: A | Discharge status: Home / Self Care | Payer: BC Managed Care – PPO | Source: Ambulatory Visit | Attending: Family Medicine | Admitting: Family Medicine

## 2020-12-28 ENCOUNTER — Ambulatory Visit (INDEPENDENT_AMBULATORY_CARE_PROVIDER_SITE_OTHER): Payer: BC Managed Care – PPO | Admitting: Family Medicine

## 2020-12-28 VITALS — BP 124/80 | HR 83 | Resp 17 | Ht 69.0 in | Wt 201.0 lb

## 2020-12-28 DIAGNOSIS — Z8249 Family history of ischemic heart disease and other diseases of the circulatory system: Secondary | ICD-10-CM

## 2020-12-28 DIAGNOSIS — M79645 Pain in left finger(s): Secondary | ICD-10-CM

## 2020-12-28 DIAGNOSIS — Z23 Encounter for immunization: Secondary | ICD-10-CM | POA: Diagnosis not present

## 2020-12-28 DIAGNOSIS — Z125 Encounter for screening for malignant neoplasm of prostate: Secondary | ICD-10-CM | POA: Diagnosis not present

## 2020-12-28 DIAGNOSIS — E782 Mixed hyperlipidemia: Secondary | ICD-10-CM | POA: Diagnosis not present

## 2020-12-28 DIAGNOSIS — I10 Essential (primary) hypertension: Secondary | ICD-10-CM | POA: Diagnosis not present

## 2020-12-28 DIAGNOSIS — Z131 Encounter for screening for diabetes mellitus: Secondary | ICD-10-CM | POA: Diagnosis not present

## 2020-12-28 DIAGNOSIS — M19042 Primary osteoarthritis, left hand: Secondary | ICD-10-CM | POA: Diagnosis not present

## 2020-12-28 LAB — HEMOGLOBIN A1C: Hgb A1c MFr Bld: 5.5 % (ref 4.6–6.5)

## 2020-12-28 LAB — COMPREHENSIVE METABOLIC PANEL
ALT: 22 U/L (ref 0–53)
AST: 16 U/L (ref 0–37)
Albumin: 4.1 g/dL (ref 3.5–5.2)
Alkaline Phosphatase: 74 U/L (ref 39–117)
BUN: 13 mg/dL (ref 6–23)
CO2: 32 mEq/L (ref 19–32)
Calcium: 9.3 mg/dL (ref 8.4–10.5)
Chloride: 105 mEq/L (ref 96–112)
Creatinine, Ser: 1.05 mg/dL (ref 0.40–1.50)
GFR: 75.41 mL/min (ref >60.00)
Glucose, Bld: 86 mg/dL (ref 70–99)
Potassium: 4.3 mEq/L (ref 3.5–5.1)
Sodium: 141 mEq/L (ref 135–145)
Total Bilirubin: 0.8 mg/dL (ref 0.2–1.2)
Total Protein: 6.3 g/dL (ref 6.0–8.3)

## 2020-12-28 LAB — CBC
HCT: 45.9 % (ref 39.0–52.0)
Hemoglobin: 15.7 g/dL (ref 13.0–17.0)
MCHC: 34.1 g/dL (ref 30.0–36.0)
MCV: 92.3 fl (ref 78.0–100.0)
Platelets: 251 10*3/uL (ref 150.0–400.0)
RBC: 4.98 Mil/uL (ref 4.22–5.81)
RDW: 13.3 % (ref 11.5–15.5)
WBC: 5 10*3/uL (ref 4.0–10.5)

## 2020-12-28 LAB — LIPID PANEL
Cholesterol: 166 mg/dL (ref 0–200)
HDL: 39.5 mg/dL (ref >39.00)
LDL Cholesterol: 108 mg/dL — ABNORMAL HIGH (ref 0–99)
NonHDL: 126.38
Total CHOL/HDL Ratio: 4
Triglycerides: 92 mg/dL (ref 0.0–149.0)
VLDL: 18.4 mg/dL (ref 0.0–40.0)

## 2020-12-28 LAB — URIC ACID: Uric Acid, Serum: 6 mg/dL (ref 4.0–7.8)

## 2020-12-28 LAB — SEDIMENTATION RATE: Sed Rate: 1 mm/hr (ref 0–20)

## 2020-12-28 LAB — PSA: PSA: 0.32 ng/mL (ref 0.10–4.00)

## 2020-12-29 ENCOUNTER — Encounter: Payer: Self-pay | Admitting: Family Medicine

## 2020-12-29 LAB — RHEUMATOID FACTOR: Rheumatoid fact SerPl-aCnc: <14 IU/mL (ref <14)

## 2021-01-05 ENCOUNTER — Encounter (HOSPITAL_COMMUNITY): Payer: Self-pay | Admitting: Family Medicine

## 2021-01-12 ENCOUNTER — Telehealth (HOSPITAL_COMMUNITY): Payer: Self-pay | Admitting: Family Medicine

## 2021-01-12 NOTE — Telephone Encounter (Signed)
Just an FYI. We have made several attempts to contact this patient including sending a letter to schedule or reschedule their GXT. We will be removing the patient from the echo [place].     01/05/21 MAILED LETTER [facility]  01/05/21 [facility] to schedule @ 9:31/[facility]  01/02/21 [facility] to schedule @ 12:06/[facility]  12/28/20 [facility] to schedule/[facility] 10:58    Thank you

## 2021-02-05 DIAGNOSIS — Z23 Encounter for immunization: Secondary | ICD-10-CM | POA: Diagnosis not present

## 2021-02-28 DIAGNOSIS — C4441 Basal cell carcinoma of skin of scalp and neck: Secondary | ICD-10-CM | POA: Diagnosis not present

## 2021-02-28 DIAGNOSIS — L821 Other seborrheic keratosis: Secondary | ICD-10-CM | POA: Diagnosis not present

## 2021-02-28 DIAGNOSIS — C44519 Basal cell carcinoma of skin of other part of trunk: Secondary | ICD-10-CM | POA: Diagnosis not present

## 2021-02-28 DIAGNOSIS — D492 Neoplasm of unspecified behavior of bone, soft tissue, and skin: Secondary | ICD-10-CM | POA: Diagnosis not present

## 2021-02-28 DIAGNOSIS — Z85828 Personal history of other malignant neoplasm of skin: Secondary | ICD-10-CM | POA: Diagnosis not present

## 2021-06-25 DIAGNOSIS — C44519 Basal cell carcinoma of skin of other part of trunk: Secondary | ICD-10-CM | POA: Diagnosis not present

## 2021-06-25 DIAGNOSIS — C4441 Basal cell carcinoma of skin of scalp and neck: Secondary | ICD-10-CM | POA: Diagnosis not present

## 2021-08-02 ENCOUNTER — Other Ambulatory Visit: Payer: Self-pay

## 2021-08-02 ENCOUNTER — Ambulatory Visit (INDEPENDENT_AMBULATORY_CARE_PROVIDER_SITE_OTHER): Payer: BC Managed Care – PPO | Admitting: Family Medicine

## 2021-08-02 VITALS — BP 140/80 | HR 89 | Temp 97.8°F | Resp 16 | Ht 69.0 in | Wt 200.0 lb

## 2021-08-02 DIAGNOSIS — Z23 Encounter for immunization: Secondary | ICD-10-CM

## 2021-08-02 DIAGNOSIS — M25532 Pain in left wrist: Secondary | ICD-10-CM

## 2021-08-02 MED ORDER — MELOXICAM 15 MG PO TABS: 15.0000 mg | ORAL_TABLET | Freq: Every day | ORAL | 0 refills | Status: DC

## 2021-08-02 NOTE — Patient Instructions (Signed)
It was good to see you again today- I am sorry your wrist is bothering you so much  Use your wrist support as needed Meloxicam as needed for pain and inflammation We will have you see one of the hand surgeons in [facility] about your wrist   Referral made to the [facility] in [place] Address: [address] [place], [place] [address] Phone: [phone] They should call you, but also ok to call them for an appt!

## 2021-08-02 NOTE — Progress Notes (Signed)
[place] at [facility] [address], [place], [place] [address] [phone]  Date:  08/02/2021   Name:  Thomas Rosario   DOB:  [birth year]   MRN:  [id number]  PCP:  Darreld Mclean, MD    Chief Complaint: Wrist Pain (Left wrist pain lateral side, aching, 2 months, no known injury)   History of Present Illness:  Thomas Rosario is a 65 y.o. very pleasant male patient who presents with the following:  Pt seen today with concern of wrist pain. History of hypertension, hyperlipidemia, skin cancer Last seen by myself in February  Married to [place]  Owns a Office manager business Flu vaccine Covid booster Shingrix done  Labs done in February   He has noted left wrist pain and constant aching for about 2 months It bothered him a lot the last time he tried to play golf It hurts all day every day, esp is he tries to extend the wrist He cannot recall an injury  He does have history of arthritis in his fingers and he has been taking tumeric- it does seem to help  He notes that he has started holding his golf club a bit differently to account for finger arthritis- ?this could have contributed   Patient Active Problem List   Diagnosis Date Noted   Postnasal drip 01/05/2019   HTN (hypertension) 12/22/2018   HLD (hyperlipidemia) 12/22/2018   Family history of heart attack 12/22/2018   Allergic rhinitis 02/01/2016   BCC (basal cell carcinoma of skin) 01/29/2012    Past Medical History:  Diagnosis Date   BCC (basal cell carcinoma of skin) 01/29/2012   HLD (hyperlipidemia) 12/22/2018   HTN (hypertension) 12/22/2018    No past surgical history on file.  Social History   Tobacco Use   Smoking status: Never   Smokeless tobacco: Never  Vaping Use   Vaping Use: Never used  Substance Use Topics   Alcohol use: Yes   Drug use: Never    Family History  Problem Relation Age of Onset   Early death Father    Heart attack Father      No Known Allergies  Medication list has been reviewed and updated.  No current outpatient medications on file prior to visit.   No current facility-administered medications on file prior to visit.    Review of Systems:  As per HPI- otherwise negative.   Physical Examination: Vitals:   08/02/21 1525  BP: 140/80  Pulse: 89  Resp: 16  Temp: 97.8 F (36.6 C)  SpO2: 98%   Vitals:   08/02/21 1525  Weight: 200 lb (90.7 kg)  Height: '5\' 9"'$  (1.753 m)   Body mass index is 29.53 kg/m. Ideal Body Weight: Weight in (lb) to have BMI = 25: 168.9  GEN: no acute distress.  Overweight, looks well  HEENT: Atraumatic, Normocephalic.  Ears and Nose: No external deformity. CV: RRR, No M/G/R. No JVD. No thrill. No extra heart sounds. PULM: CTA B, no wheezes, crackles, rhonchi. No retractions. No resp. distress. No accessory muscle use. EXTR: No c/c/e PSYCH: Normally interactive. Conversant.  Left wrist: minimal swelling and tenderness over the palmar radial aspect of the wrist which is more sever when the wrist is extended.  Normal strength and ROM of the wrist   Assessment and Plan: Left wrist pain - Plan: Ambulatory referral to Hand Surgery, meloxicam (MOBIC) 15 MG tablet  Immunization due - Plan: Flu Vaccine  QUAD 6+ mos PF IM (Fluarix Quad PF)  Pt seen today with wrist pain- likely a strain/ sprain It has bothered him significantly- refer to ortho Mobic as needed in the meantime Flu shot today  This visit occurred during the SARS-CoV-2 public health emergency.  Safety protocols were in place, including screening questions prior to the visit, additional usage of staff PPE, and extensive cleaning of exam room while observing appropriate contact time as indicated for disinfecting solutions.   Signed Lamar Blinks, MD

## 2021-08-15 DIAGNOSIS — M1812 Unilateral primary osteoarthritis of first carpometacarpal joint, left hand: Secondary | ICD-10-CM | POA: Diagnosis not present

## 2021-08-15 DIAGNOSIS — M19032 Primary osteoarthritis, left wrist: Secondary | ICD-10-CM | POA: Diagnosis not present

## 2021-09-23 IMAGING — DX DG LUMBAR SPINE COMPLETE 4+V
5 series · 5 of 5 positions shown · non-contrast
Comparison: None.

CLINICAL DATA: Chronic left-sided low back and left leg pain.

EXAM:
LUMBAR SPINE - COMPLETE 4+ VIEW

[l-spine ap]
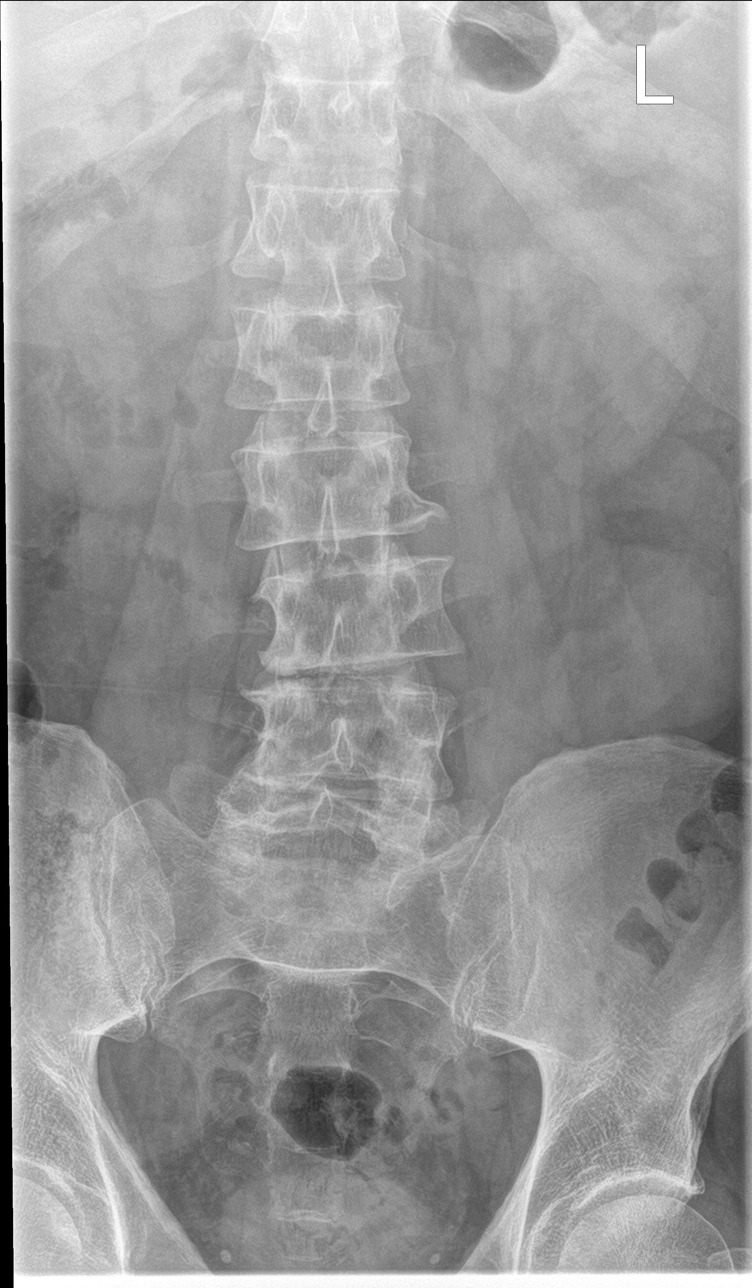

[l-spine obl (1 of 2)]
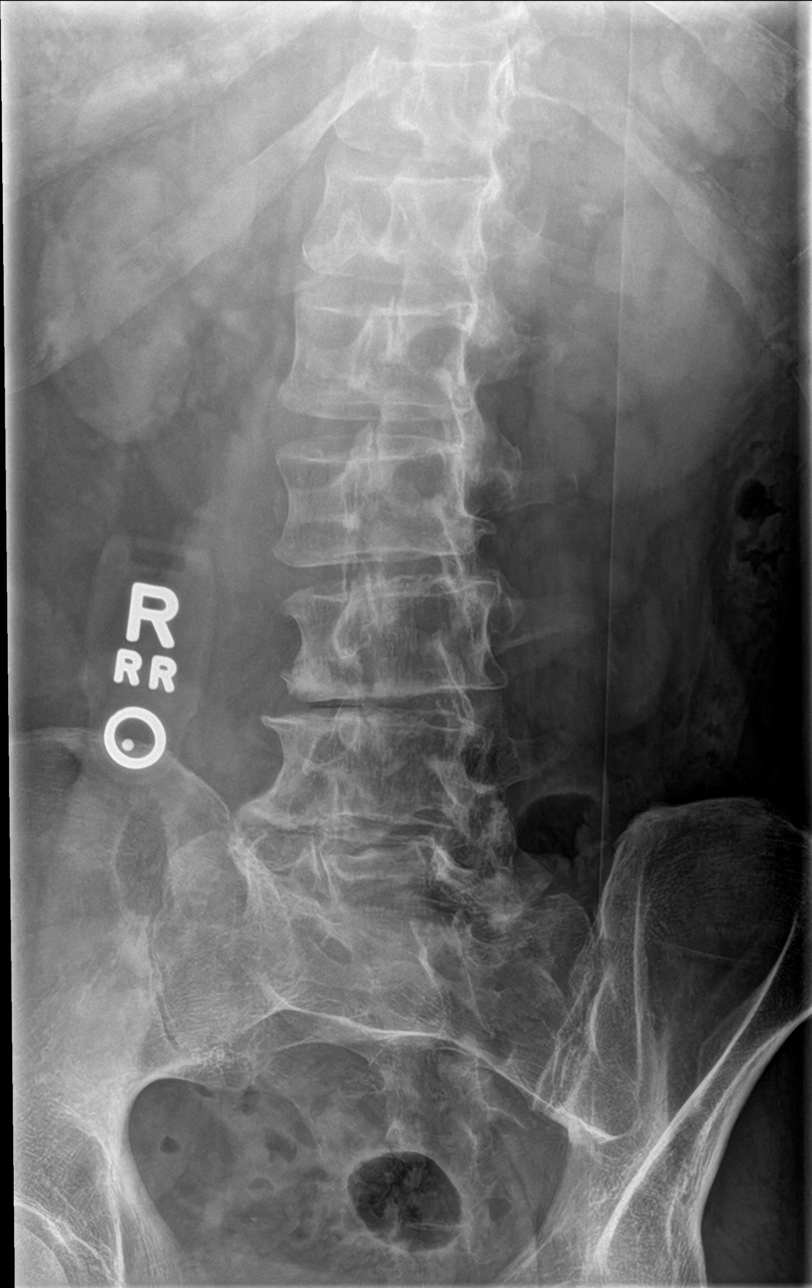

[l-spine obl (2 of 2)]
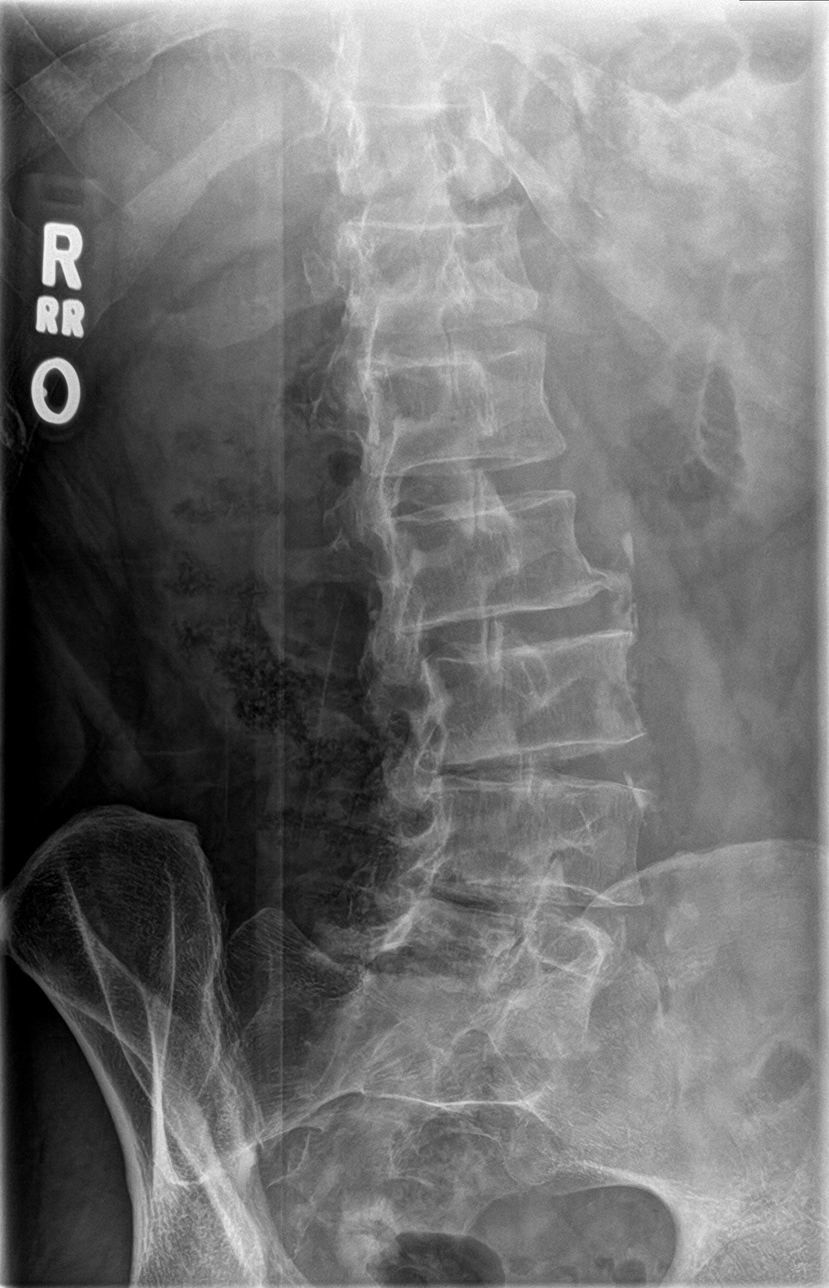

[l-spine lat]
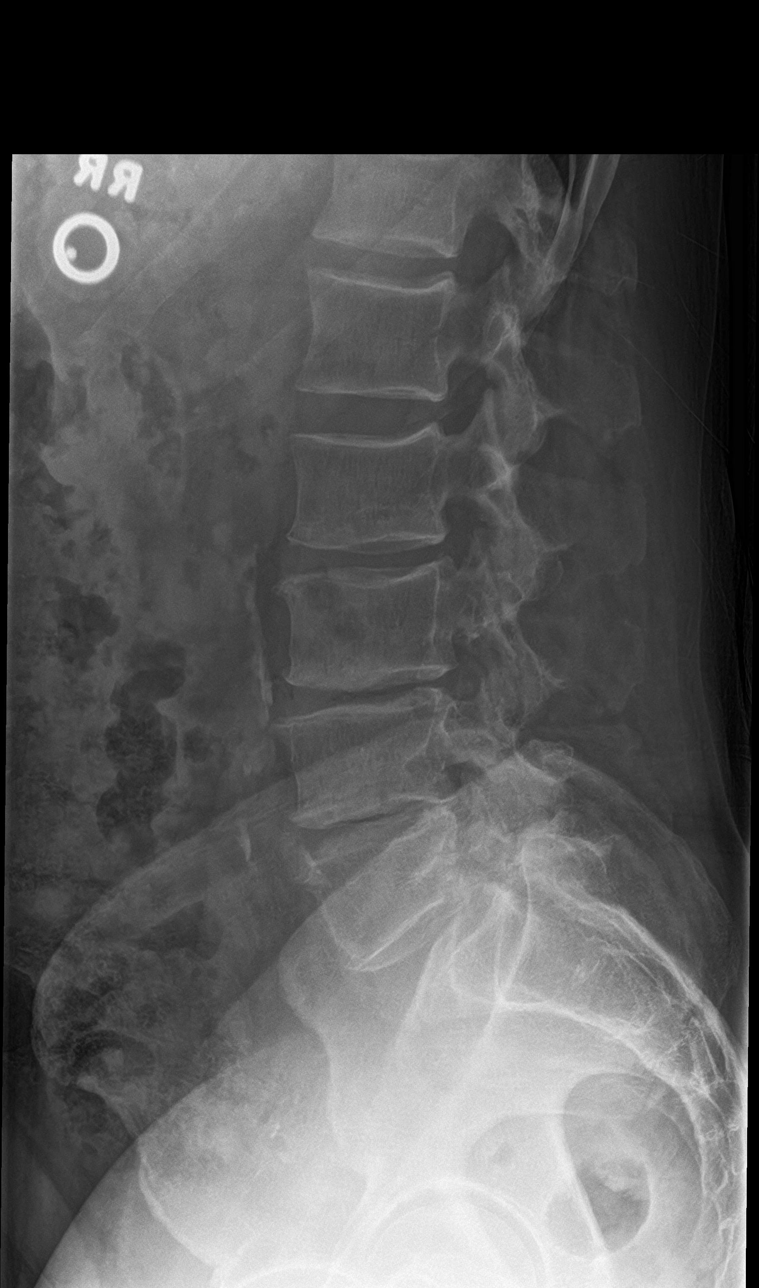

[l-spine spot]
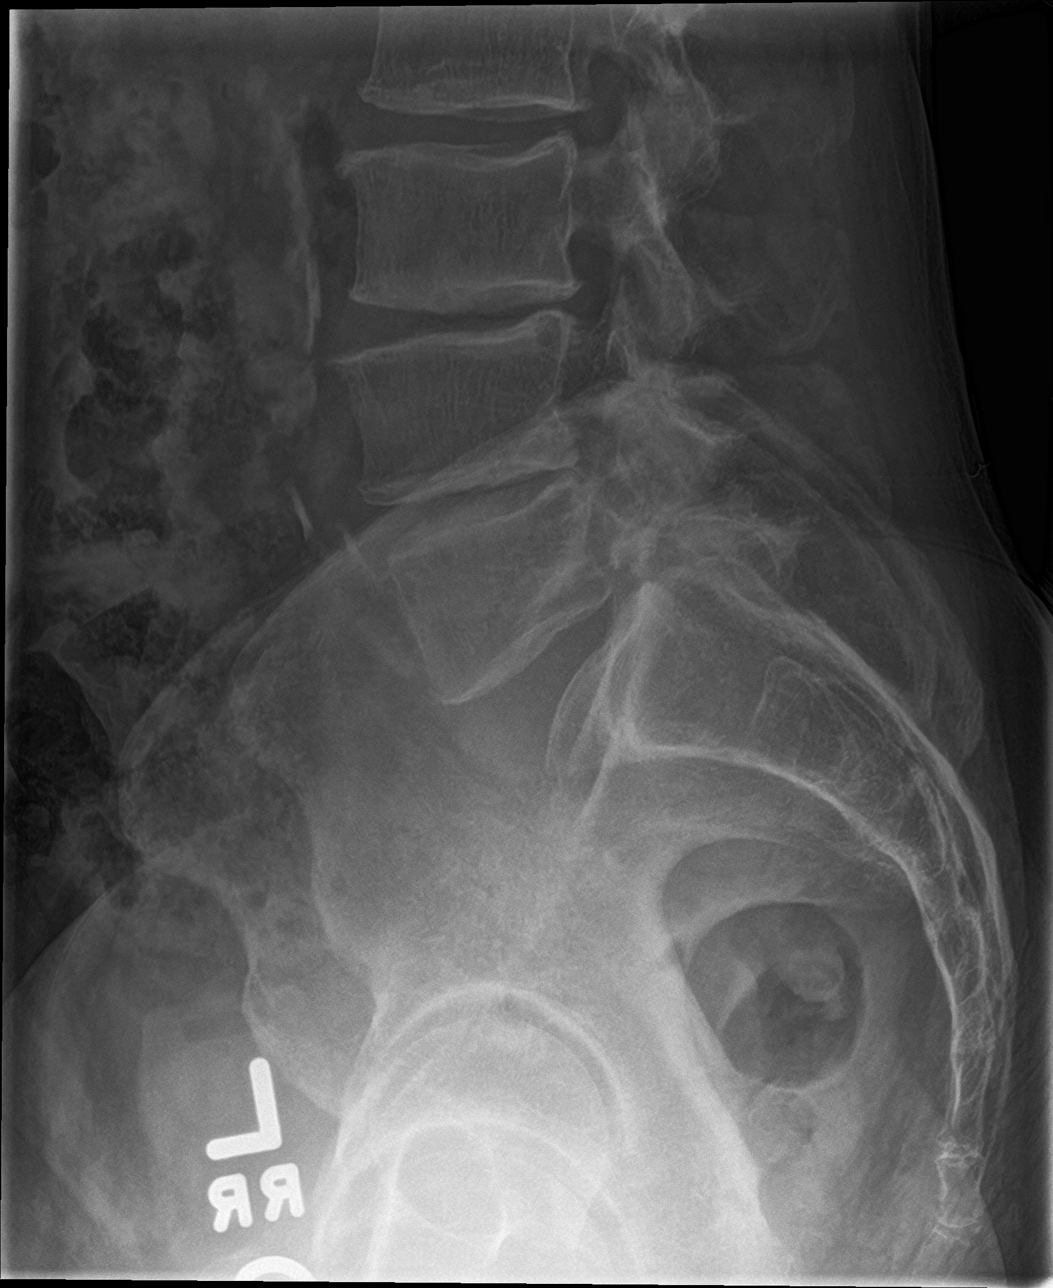

[5 of 5 positions shown; findings below may reference images not displayed]

FINDINGS: No fracture or spondylolisthesis is noted. Moderate degenerative
disc disease is noted at L2-3, L3-4 and L4-5. Atherosclerosis of
abdominal aorta is noted.
IMPRESSION: Moderate multilevel degenerative disc disease. No acute abnormality
seen in the lumbar spine.

Aortic Atherosclerosis ([id number]-WOD.D).

## 2022-01-02 DIAGNOSIS — M19032 Primary osteoarthritis, left wrist: Secondary | ICD-10-CM | POA: Diagnosis not present

## 2022-01-02 DIAGNOSIS — S6982XA Other specified injuries of left wrist, hand and finger(s), initial encounter: Secondary | ICD-10-CM | POA: Diagnosis not present

## 2022-01-07 ENCOUNTER — Encounter: Payer: Self-pay | Admitting: Family Medicine

## 2022-01-07 ENCOUNTER — Ambulatory Visit (INDEPENDENT_AMBULATORY_CARE_PROVIDER_SITE_OTHER): Payer: BC Managed Care – PPO | Admitting: Family Medicine

## 2022-01-07 VITALS — BP 128/88 | HR 77 | Ht 69.0 in | Wt 203.0 lb

## 2022-01-07 DIAGNOSIS — R030 Elevated blood-pressure reading, without diagnosis of hypertension: Secondary | ICD-10-CM

## 2022-01-07 NOTE — Progress Notes (Signed)
Acute Office Visit  Subjective:    Patient ID: Thomas Rosario, male    DOB: 08/16/1956, 66 y.o.   MRN: [id number]  CC: elevated BP   HPI Patient is in today for elevated BP readings  Patient states he was recently getting a cortisone shot in his hand and his BP at that appointment was elevated around 149/89 which made him concerned. States he occasionally checks BP at home and readings can be around 140/80 but he is not sure if he is checking correctly. He has not had any chest pain, dyspnea, palpitations, vision changes, edema, headaches.       Past Medical History:  Diagnosis Date   BCC (basal cell carcinoma of skin) 01/29/2012   HLD (hyperlipidemia) 12/22/2018   HTN (hypertension) 12/22/2018    No past surgical history on file.  Family History  Problem Relation Age of Onset   Early death Father    Heart attack Father     Social History   Socioeconomic History   Marital status: Married    Spouse name: Not on file   Number of children: Not on file   Years of education: Not on file   Highest education level: Not on file  Occupational History   Not on file  Tobacco Use   Smoking status: Never   Smokeless tobacco: Never  Vaping Use   Vaping Use: Never used  Substance and Sexual Activity   Alcohol use: Yes   Drug use: Never   Sexual activity: Not on file  Other Topics Concern   Not on file  Social History Narrative   Not on file   Social Determinants of Health   Financial Resource Strain: Not on file  Food Insecurity: Not on file  Transportation Needs: Not on file  Physical Activity: Not on file  Stress: Not on file  Social Connections: Not on file  Intimate Partner Violence: Not on file    Outpatient Medications Prior to Visit  Medication Sig Dispense Refill   meloxicam (MOBIC) 15 MG tablet Take 1 tablet (15 mg total) by mouth daily. Use as needed 30 tablet 0   No facility-administered medications prior to visit.    No Known Allergies  Review of  Systems All review of systems negative except what is listed in the HPI     Objective:    Physical Exam Vitals reviewed.  Constitutional:      Appearance: Normal appearance.  HENT:     Head: Normocephalic and atraumatic.  Cardiovascular:     Rate and Rhythm: Normal rate and regular rhythm.     Pulses: Normal pulses.     Heart sounds: Normal heart sounds.  Pulmonary:     Effort: Pulmonary effort is normal.     Breath sounds: Normal breath sounds.  Musculoskeletal:     Right lower leg: No edema.     Left lower leg: No edema.  Skin:    General: Skin is warm and dry.  Neurological:     General: No focal deficit present.     Mental Status: He is alert and oriented to person, place, and time. Mental status is at baseline.  Psychiatric:        Mood and Affect: Mood normal.        Behavior: Behavior normal.        Thought Content: Thought content normal.        Judgment: Judgment normal.    BP 128/88    Pulse 77  Ht 5\' 9"  (1.753 m)    Wt 203 lb (92.1 kg)    BMI 29.98 kg/m  Wt Readings from Last 3 Encounters:  01/07/22 203 lb (92.1 kg)  08/02/21 200 lb (90.7 kg)  12/28/20 201 lb (91.2 kg)    Health Maintenance Due  Topic Date Due   HIV Screening  Never done   COVID-19 Vaccine (3 - Moderna risk series) 03/28/2020    There are no preventive care reminders to display for this patient.   No results found for: TSH Lab Results  Component Value Date   WBC 5.0 12/28/2020   HGB 15.7 12/28/2020   HCT 45.9 12/28/2020   MCV 92.3 12/28/2020   PLT 251.0 12/28/2020   Lab Results  Component Value Date   NA 141 12/28/2020   K 4.3 12/28/2020   CO2 32 12/28/2020   GLUCOSE 86 12/28/2020   BUN 13 12/28/2020   CREATININE 1.05 12/28/2020   BILITOT 0.8 12/28/2020   ALKPHOS 74 12/28/2020   AST 16 12/28/2020   ALT 22 12/28/2020   PROT 6.3 12/28/2020   ALBUMIN 4.1 12/28/2020   CALCIUM 9.3 12/28/2020   GFR 75.41 12/28/2020   Lab Results  Component Value Date   CHOL 166  12/28/2020   Lab Results  Component Value Date   HDL 39.50 12/28/2020   Lab Results  Component Value Date   LDLCALC 108 (H) 12/28/2020   Lab Results  Component Value Date   TRIG 92.0 12/28/2020   Lab Results  Component Value Date   CHOLHDL 4 12/28/2020   Lab Results  Component Value Date   HGBA1C 5.5 12/28/2020       Assessment & Plan:    1. Elevated blood pressure reading without diagnosis of hypertension BP stable in office today. Reassurance and education provided. Encouraged him to keep a log of BP readings over the next 1-2 weeks (educated on proper way to check BP), and send readings via MyChart message. Patient aware of signs/symptoms requiring further/urgent evaluation.    Follow-up as needed.   Terrilyn Saver, NP

## 2022-01-07 NOTE — Patient Instructions (Signed)
Blood pressure is good today.   For the next 1-2 weeks, check your blood pressure at the same time daily after resting for 10 minutes, legs not crossed, arm resting at heart level.  Keep a log and send in readings via MyChart.

## 2022-02-04 ENCOUNTER — Ambulatory Visit (INDEPENDENT_AMBULATORY_CARE_PROVIDER_SITE_OTHER): Payer: BC Managed Care – PPO | Admitting: Family Medicine

## 2022-02-04 ENCOUNTER — Encounter: Payer: Self-pay | Admitting: Family Medicine

## 2022-02-04 VITALS — BP 127/77 | HR 90 | Temp 97.7°F | Ht 69.0 in | Wt 197.0 lb

## 2022-02-04 DIAGNOSIS — J040 Acute laryngitis: Secondary | ICD-10-CM | POA: Diagnosis not present

## 2022-02-04 DIAGNOSIS — J309 Allergic rhinitis, unspecified: Secondary | ICD-10-CM | POA: Diagnosis not present

## 2022-02-04 MED ORDER — AZELASTINE HCL 0.1 % NA SOLN: 2.0000 | Freq: Two times a day (BID) | NASAL | 1 refills | Status: DC

## 2022-02-04 MED ORDER — FLUTICASONE PROPIONATE 50 MCG/ACT NA SUSP: 2.0000 | Freq: Every day | NASAL | 0 refills | Status: DC

## 2022-02-04 NOTE — Patient Instructions (Signed)
Possibly viral or allergic cause. ?Take your Claritin daily. Adding 2 new nose sprays to help with allergies/drainage.  ?Continue supportive measures including rest, hydration, humidifier use, steam showers, warm compresses to sinuses, warm liquids with lemon and honey, and over-the-counter cough, cold, and analgesics as needed.   ?

## 2022-02-04 NOTE — Progress Notes (Signed)
? ?Acute Office Visit ? ?Subjective:  ? ? Patient ID: Thomas Rosario, male    DOB: 01-24-1956, 66 y.o.   MRN: [id number] ? ?CC: hoarse voice ? ? ?HPI ?Patient is in today for hoarse voice.  ? ?Patient reports that 2 days ago, on Saturday he started noticing burning/itching eyes, irritated throat, increase post nasal drainage, sneezing, fatigue - he got some improvement with a Claritin. Yesterday he was starting to feel better but his throat was irritated to talk and he ended up losing his voice almost completely. He has tried salt water gargles and reports he has slight voice today (wife present to help speak), and he is feeling pretty good overall. Reports he has regular postnasal drainage at baseline and spring allergies. Wife states she has similar symptoms last week that weren't quite as severe and resolved within a day or two. He reports frequent throat clearing, sneezing, and rare productive cough. He denies any fevers, n/v/d, sinus pressure, headaches, ear pain/itching, itchy watery eyes, fatigue.  ? ? ? ?Past Medical History:  ?Diagnosis Date  ? BCC (basal cell carcinoma of skin) 01/29/2012  ? HLD (hyperlipidemia) 12/22/2018  ? HTN (hypertension) 12/22/2018  ? ? ?No past surgical history on file. ? ?Family History  ?Problem Relation Age of Onset  ? Early death Father   ? Heart attack Father   ? ? ?Social History  ? ?Socioeconomic History  ? Marital status: Married  ?  Spouse name: Not on file  ? Number of children: Not on file  ? Years of education: Not on file  ? Highest education level: Not on file  ?Occupational History  ? Not on file  ?Tobacco Use  ? Smoking status: Never  ? Smokeless tobacco: Never  ?Vaping Use  ? Vaping Use: Never used  ?Substance and Sexual Activity  ? Alcohol use: Yes  ? Drug use: Never  ? Sexual activity: Not on file  ?Other Topics Concern  ? Not on file  ?Social History Narrative  ? Not on file  ? ?Social Determinants of Health  ? ?Financial Resource Strain: Not on file  ?Food Insecurity:  Not on file  ?Transportation Needs: Not on file  ?Physical Activity: Not on file  ?Stress: Not on file  ?Social Connections: Not on file  ?Intimate Partner Violence: Not on file  ? ? ?No outpatient medications prior to visit.  ? ?No facility-administered medications prior to visit.  ? ? ?No Known Allergies ? ?Review of Systems ?All review of systems negative except what is listed in the HPI ? ?   ?Objective:  ?  ?Physical Exam ?Vitals reviewed.  ?Constitutional:   ?   Appearance: Normal appearance.  ?HENT:  ?   Head: Normocephalic and atraumatic.  ?   Comments: hoarse ?   Right Ear: Tympanic membrane normal.  ?   Left Ear: Tympanic membrane normal.  ?   Nose: Nose normal.  ?   Mouth/Throat:  ?   Mouth: Mucous membranes are moist.  ?   Pharynx: Oropharynx is clear.  ?Eyes:  ?   Extraocular Movements: Extraocular movements intact.  ?   Conjunctiva/sclera: Conjunctivae normal.  ?Cardiovascular:  ?   Rate and Rhythm: Normal rate and regular rhythm.  ?Pulmonary:  ?   Effort: Pulmonary effort is normal.  ?   Breath sounds: Normal breath sounds.  ?Musculoskeletal:  ?   Cervical back: Normal range of motion and neck supple. No tenderness.  ?Lymphadenopathy:  ?   Cervical: No  cervical adenopathy.  ?Skin: ?   General: Skin is warm and dry.  ?   Capillary Refill: Capillary refill takes less than 2 seconds.  ?Neurological:  ?   General: No focal deficit present.  ?   Mental Status: He is alert and oriented to person, place, and time. Mental status is at baseline.  ?Psychiatric:     ?   Mood and Affect: Mood normal.     ?   Behavior: Behavior normal.     ?   Thought Content: Thought content normal.     ?   Judgment: Judgment normal.  ? ? ?BP 127/77   Pulse 90   Temp 97.7 ?F (36.5 ?C)   Ht '5\' 9"'$  (1.753 m)   Wt 197 lb (89.4 kg)   BMI 29.09 kg/m?  ?Wt Readings from Last 3 Encounters:  ?02/04/22 197 lb (89.4 kg)  ?01/07/22 203 lb (92.1 kg)  ?08/02/21 200 lb (90.7 kg)  ? ? ?Health Maintenance Due  ?Topic Date Due  ? HIV  Screening  Never done  ? COVID-19 Vaccine (3 - Moderna risk series) 03/28/2020  ? ? ?There are no preventive care reminders to display for this patient. ? ? ?No results found for: TSH ?Lab Results  ?Component Value Date  ? WBC 5.0 12/28/2020  ? HGB 15.7 12/28/2020  ? HCT 45.9 12/28/2020  ? MCV 92.3 12/28/2020  ? PLT 251.0 12/28/2020  ? ?Lab Results  ?Component Value Date  ? NA 141 12/28/2020  ? K 4.3 12/28/2020  ? CO2 32 12/28/2020  ? GLUCOSE 86 12/28/2020  ? BUN 13 12/28/2020  ? CREATININE 1.05 12/28/2020  ? BILITOT 0.8 12/28/2020  ? ALKPHOS 74 12/28/2020  ? AST 16 12/28/2020  ? ALT 22 12/28/2020  ? PROT 6.3 12/28/2020  ? ALBUMIN 4.1 12/28/2020  ? CALCIUM 9.3 12/28/2020  ? GFR 75.41 12/28/2020  ? ?Lab Results  ?Component Value Date  ? CHOL 166 12/28/2020  ? ?Lab Results  ?Component Value Date  ? HDL 39.50 12/28/2020  ? ?Lab Results  ?Component Value Date  ? LDLCALC 108 (H) 12/28/2020  ? ?Lab Results  ?Component Value Date  ? TRIG 92.0 12/28/2020  ? ?Lab Results  ?Component Value Date  ? CHOLHDL 4 12/28/2020  ? ?Lab Results  ?Component Value Date  ? HGBA1C 5.5 12/28/2020  ? ? ?   ?Assessment & Plan:  ? ?1. Laryngitis ?2. Allergic rhinitis, unspecified seasonality, unspecified trigger ?Possibly viral or allergic cause. ?Take your Claritin daily. Adding 2 new nose sprays to help with allergies/drainage.  ?Continue supportive measures including rest, hydration, humidifier use, steam showers, warm compresses to sinuses, warm liquids with lemon and honey, and over-the-counter cough, cold, and analgesics as needed. ?See attached handout for additional laryngitis info - encouraged voice rest, gargles, sips of liquid, throat lozenges, etc.  ?Patient aware of signs/symptoms requiring further/urgent evaluation.  ? ?- fluticasone (FLONASE) 50 MCG/ACT nasal spray; Place 2 sprays into both nostrils daily.  Dispense: 1 g; Refill: 0 ?- azelastine (ASTELIN) 0.1 % nasal spray; Place 2 sprays into both nostrils 2 (two) times daily.  Use in each nostril as directed  Dispense: 301 mL; Refill: 1 ? ? ?Please contact office for follow-up if symptoms do not improve or worsen. Seek emergency care if symptoms become severe. ? ? ?Terrilyn Saver, NP ? ?

## 2022-02-11 DIAGNOSIS — W57XXXA Bitten or stung by nonvenomous insect and other nonvenomous arthropods, initial encounter: Secondary | ICD-10-CM | POA: Diagnosis not present

## 2022-02-11 DIAGNOSIS — S1096XA Insect bite of unspecified part of neck, initial encounter: Secondary | ICD-10-CM | POA: Diagnosis not present

## 2022-04-17 ENCOUNTER — Encounter: Payer: Self-pay | Admitting: Family Medicine

## 2022-04-17 ENCOUNTER — Ambulatory Visit: Payer: Medicare (Managed Care) | Admitting: Family Medicine

## 2022-04-17 VITALS — BP 124/80 | HR 100 | Temp 98.0°F | Resp 18 | Ht 69.0 in | Wt 199.4 lb

## 2022-04-17 DIAGNOSIS — Z131 Encounter for screening for diabetes mellitus: Secondary | ICD-10-CM

## 2022-04-17 DIAGNOSIS — E782 Mixed hyperlipidemia: Secondary | ICD-10-CM

## 2022-04-17 DIAGNOSIS — J209 Acute bronchitis, unspecified: Secondary | ICD-10-CM

## 2022-04-17 DIAGNOSIS — Z125 Encounter for screening for malignant neoplasm of prostate: Secondary | ICD-10-CM

## 2022-04-17 DIAGNOSIS — R059 Cough, unspecified: Secondary | ICD-10-CM | POA: Diagnosis not present

## 2022-04-17 DIAGNOSIS — I10 Essential (primary) hypertension: Secondary | ICD-10-CM | POA: Diagnosis not present

## 2022-04-17 DIAGNOSIS — R0982 Postnasal drip: Secondary | ICD-10-CM

## 2022-04-17 LAB — COMPREHENSIVE METABOLIC PANEL
ALT: 22 U/L (ref 0–53)
AST: 16 U/L (ref 0–37)
Albumin: 4.1 g/dL (ref 3.5–5.2)
Alkaline Phosphatase: 82 U/L (ref 39–117)
BUN: 14 mg/dL (ref 6–23)
CO2: 31 mEq/L (ref 19–32)
Calcium: 9.6 mg/dL (ref 8.4–10.5)
Chloride: 103 mEq/L (ref 96–112)
Creatinine, Ser: 1 mg/dL (ref 0.40–1.50)
GFR: 79.23 mL/min (ref >60.00)
Glucose, Bld: 104 mg/dL — ABNORMAL HIGH (ref 70–99)
Potassium: 3.9 mEq/L (ref 3.5–5.1)
Sodium: 141 mEq/L (ref 135–145)
Total Bilirubin: 0.7 mg/dL (ref 0.2–1.2)
Total Protein: 6.3 g/dL (ref 6.0–8.3)

## 2022-04-17 LAB — LIPID PANEL
Cholesterol: 187 mg/dL (ref 0–200)
HDL: 42.2 mg/dL (ref >39.00)
LDL Cholesterol: 110 mg/dL — ABNORMAL HIGH (ref 0–99)
NonHDL: 144.98
Total CHOL/HDL Ratio: 4
Triglycerides: 174 mg/dL — ABNORMAL HIGH (ref 0.0–149.0)
VLDL: 34.8 mg/dL (ref 0.0–40.0)

## 2022-04-17 LAB — CBC
HCT: 46.9 % (ref 39.0–52.0)
Hemoglobin: 15.6 g/dL (ref 13.0–17.0)
MCHC: 33.3 g/dL (ref 30.0–36.0)
MCV: 93 fl (ref 78.0–100.0)
Platelets: 253 10*3/uL (ref 150.0–400.0)
RBC: 5.04 Mil/uL (ref 4.22–5.81)
RDW: 13.1 % (ref 11.5–15.5)
WBC: 5.5 10*3/uL (ref 4.0–10.5)

## 2022-04-17 LAB — POC COVID19 BINAXNOW: SARS Coronavirus 2 Ag: NEGATIVE

## 2022-04-17 LAB — PSA: PSA: 0.4 ng/mL (ref 0.10–4.00)

## 2022-04-17 LAB — HEMOGLOBIN A1C: Hgb A1c MFr Bld: 5.5 % (ref 4.6–6.5)

## 2022-04-17 MED ORDER — HYDROCODONE BIT-HOMATROP MBR 5-1.5 MG/5ML PO SOLN: 5.0000 mL | Freq: Three times a day (TID) | ORAL | 0 refills | Status: DC | PRN

## 2022-04-17 MED ORDER — IPRATROPIUM BROMIDE 0.03 % NA SOLN: 2.0000 | Freq: Three times a day (TID) | NASAL | 12 refills | Status: DC

## 2022-04-17 MED ORDER — AZITHROMYCIN 250 MG PO TABS: ORAL_TABLET | ORAL | 0 refills | Status: AC

## 2022-04-17 NOTE — Progress Notes (Addendum)
[place] [facility] [address], [place],  [address] [phone]  Date:  04/17/2022   Name:  Thomas Rosario   DOB:  03-10-1956   MRN:  [id number]  PCP:  Darreld Mclean, MD    Chief Complaint: Cough (X Sunday. Has tried multiple OTC tx that has not helped. Takes Zyrtec every day. No covid test.)   History of Present Illness:  Thomas Rosario is a 66 y.o. very pleasant male patient who presents with the following:  Pt seen today with cough-he thinks may be allergies?  His wife was ill with similar symptoms a couple of weeks ago, she is now well History of hypertension, hyperlipidemia, skin cancer Last seen by myself 9/22 with a wrist problem   He has been coughing for 4-5 days They are traveling to [place] in a week and he wants to be sure he is not coughing  He may have cough attacks  He has tried OTC medications as needed  He is on zyrtec and added some flonase  Some itchy eyes, no sneezing He is otherwise feeling well except he may get a HA due to cough   Can update pneumonia vaccine- later date as he is ill today  Labs can be drawn today if he likes   Patient Active Problem List   Diagnosis Date Noted   Postnasal drip 01/05/2019   HTN (hypertension) 12/22/2018   HLD (hyperlipidemia) 12/22/2018   Family history of heart attack 12/22/2018   Allergic rhinitis 02/01/2016   BCC (basal cell carcinoma of skin) 01/29/2012    Past Medical History:  Diagnosis Date   BCC (basal cell carcinoma of skin) 01/29/2012   HLD (hyperlipidemia) 12/22/2018   HTN (hypertension) 12/22/2018    No past surgical history on file.  Social History   Tobacco Use   Smoking status: Never   Smokeless tobacco: Never  Vaping Use   Vaping Use: Never used  Substance Use Topics   Alcohol use: Yes   Drug use: Never    Family History  Problem Relation Age of Onset   Early death Father    Heart attack Father     No Known  Allergies  Medication list has been reviewed and updated.  Current Outpatient Medications on File Prior to Visit  Medication Sig Dispense Refill   azelastine (ASTELIN) 0.1 % nasal spray Place 2 sprays into both nostrils 2 (two) times daily. Use in each nostril as directed 301 mL 1   fluticasone (FLONASE) 50 MCG/ACT nasal spray Place 2 sprays into both nostrils daily. 1 g 0   No current facility-administered medications on file prior to visit.    Review of Systems:  As per HPI- otherwise negative.   Physical Examination: Vitals:   04/17/22 1000  BP: 124/80  Pulse: 100  Resp: 18  Temp: 98 F (36.7 C)  SpO2: 95%   Vitals:   04/17/22 1000  Weight: 199 lb 6.4 oz (90.4 kg)  Height: '5\' 9"'$  (1.753 m)   Body mass index is 29.45 kg/m. Ideal Body Weight: Weight in (lb) to have BMI = 25: 168.9  GEN: no acute distress.  Mild overweight, looks well -coughing periodically HEENT: Atraumatic, Normocephalic.  Bilateral TM wnl, oropharynx normal.  PEERL,EOMI.   Ears and Nose: No external deformity. CV: RRR, No M/G/R. No JVD. No thrill. No extra heart sounds. PULM: CTA B, no wheezes, crackles, rhonchi. No retractions. No resp. distress. No  accessory muscle use. EXTR: No c/c/e PSYCH: Normally interactive. Conversant.    Results for orders placed or performed in visit on 04/17/22  POC COVID-19 BinaxNow  Result Value Ref Range   SARS Coronavirus 2 Ag Negative Negative    Assessment and Plan: Cough, unspecified type - Plan: POC COVID-19 BinaxNow  Mixed hyperlipidemia - Plan: Lipid panel  Essential hypertension - Plan: CBC, Comprehensive metabolic panel  Screening for prostate cancer - Plan: PSA  Screening for diabetes mellitus - Plan: Comprehensive metabolic panel, Hemoglobin A1c  Acute bronchitis, unspecified organism - Plan: azithromycin (ZITHROMAX) 250 MG tablet, HYDROcodone bit-homatropine (HYCODAN) 5-1.5 MG/5ML syrup  PND (post-nasal drip) - Plan: ipratropium (ATROVENT)  0.03 % nasal spray  Patient seen today with cough.  May be viral or allergic, will treat with antibiotic as he plans an international trip next week.  Prescribed azithromycin.  Hycodan syrup as needed for cough, caution regarding sedation He is asked to let me know if not feeling better within the next several days.  Can continue allergy medicine He also notes postnasal drainage, no history of glaucoma or urinary retention.  We will have him try Atrovent nasal as needed  Signed Lamar Blinks, MD  Received labs as below, message to patient Results for orders placed or performed in visit on 04/17/22  CBC  Result Value Ref Range   WBC 5.5 4.0 - 10.5 K/uL   RBC 5.04 4.22 - 5.81 Mil/uL   Platelets 253.0 150.0 - 400.0 K/uL   Hemoglobin 15.6 13.0 - 17.0 g/dL   HCT 46.9 39.0 - 52.0 %   MCV 93.0 78.0 - 100.0 fl   MCHC 33.3 30.0 - 36.0 g/dL   RDW 13.1 11.5 - 15.5 %  Comprehensive metabolic panel  Result Value Ref Range   Sodium 141 135 - 145 mEq/L   Potassium 3.9 3.5 - 5.1 mEq/L   Chloride 103 96 - 112 mEq/L   CO2 31 19 - 32 mEq/L   Glucose, Bld 104 (H) 70 - 99 mg/dL   BUN 14 6 - 23 mg/dL   Creatinine, Ser 1.00 0.40 - 1.50 mg/dL   Total Bilirubin 0.7 0.2 - 1.2 mg/dL   Alkaline Phosphatase 82 39 - 117 U/L   AST 16 0 - 37 U/L   ALT 22 0 - 53 U/L   Total Protein 6.3 6.0 - 8.3 g/dL   Albumin 4.1 3.5 - 5.2 g/dL   GFR 79.23 >60.00 mL/min   Calcium 9.6 8.4 - 10.5 mg/dL  Hemoglobin A1c  Result Value Ref Range   Hgb A1c MFr Bld 5.5 4.6 - 6.5 %  Lipid panel  Result Value Ref Range   Cholesterol 187 0 - 200 mg/dL   Triglycerides 174.0 (H) 0.0 - 149.0 mg/dL   HDL 42.20 >39.00 mg/dL   VLDL 34.8 0.0 - 40.0 mg/dL   LDL Cholesterol 110 (H) 0 - 99 mg/dL   Total CHOL/HDL Ratio 4    NonHDL 144.98   PSA  Result Value Ref Range   PSA 0.40 0.10 - 4.00 ng/mL  POC COVID-19 BinaxNow  Result Value Ref Range   SARS Coronavirus 2 Ag Negative Negative

## 2022-04-17 NOTE — Patient Instructions (Signed)
Good to see you today- have a great time on your trip! Antibiotic- zpack for 5 days Use cough syrup as needed but be aware of possible sedation Atrovent nasal spray for post nasal drainage I will be in touch with your labs asap  Please let me know if you are not feeing better in the next few days  Let's recheck in 6 months assuming all is well

## 2022-06-06 ENCOUNTER — Encounter: Payer: Medicare (Managed Care) | Admitting: Family Medicine

## 2022-08-11 IMAGING — DX DG HAND COMPLETE 3+V*L*
3 series · 3 of 3 positions shown · non-contrast
Comparison: None.

CLINICAL DATA: Left ring finger pain

EXAM:
LEFT HAND - COMPLETE 3+ VIEW

[hand pa]
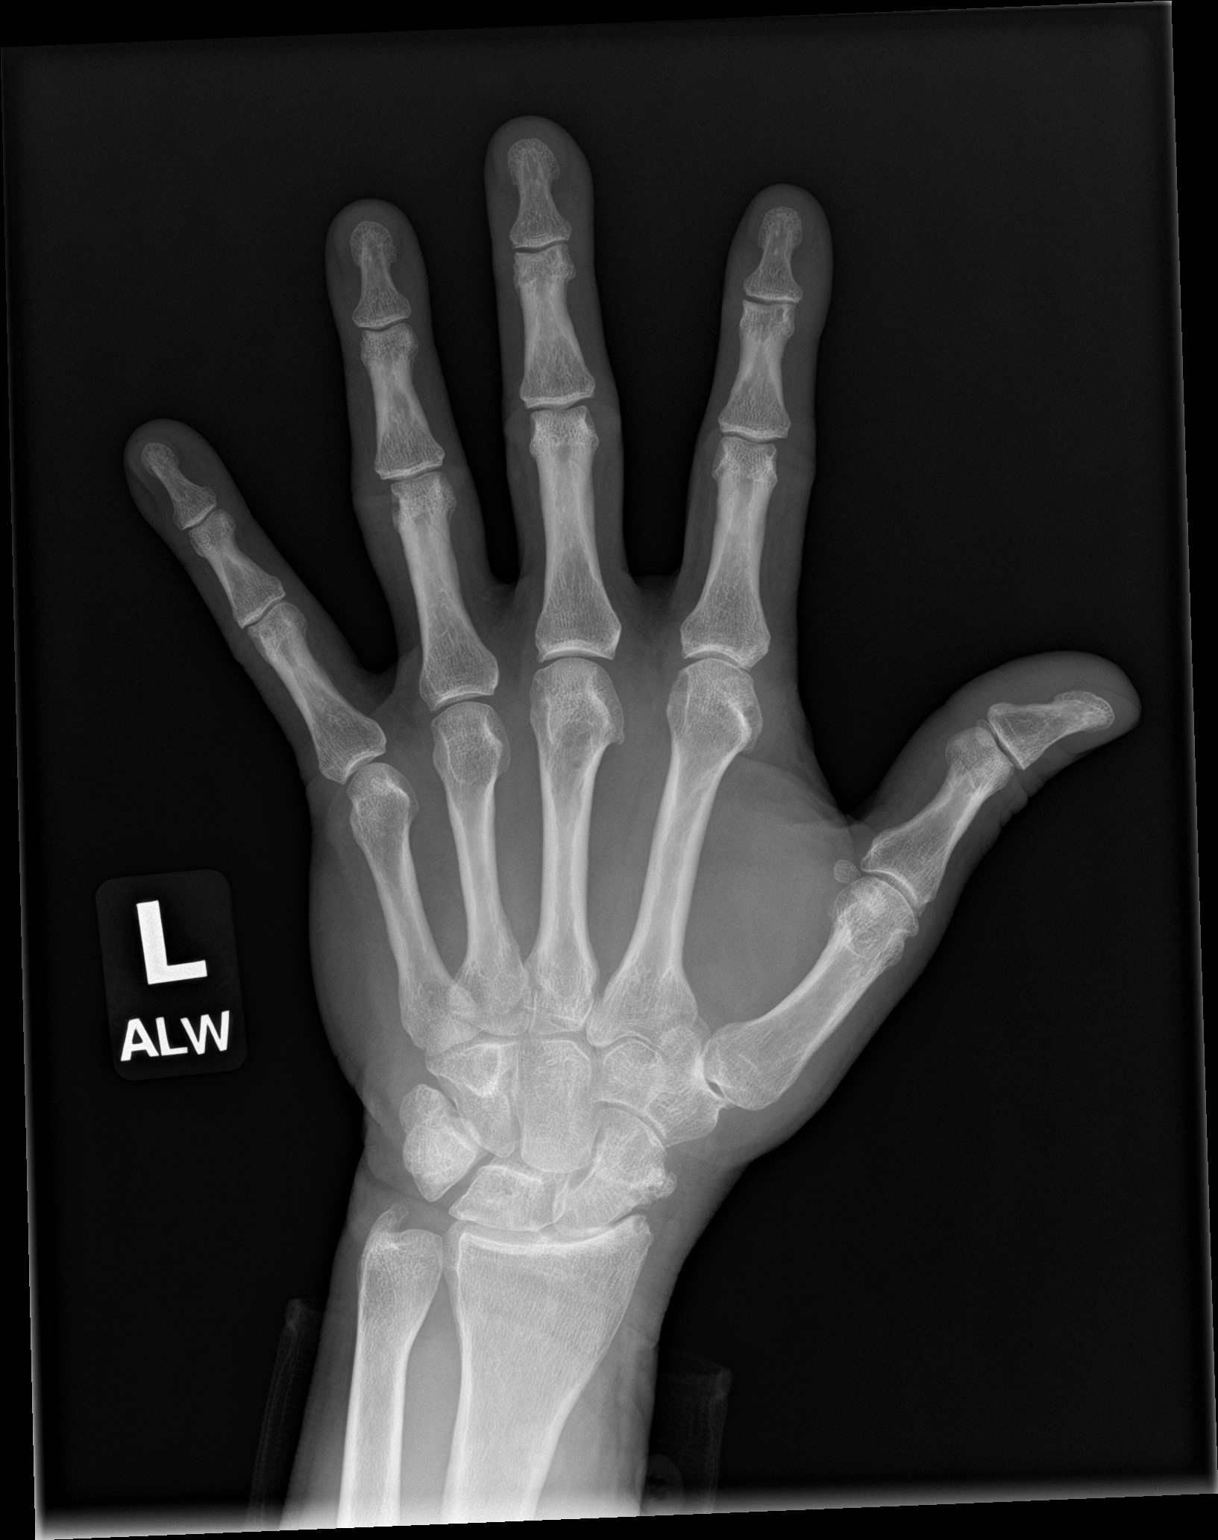

[hand lat]
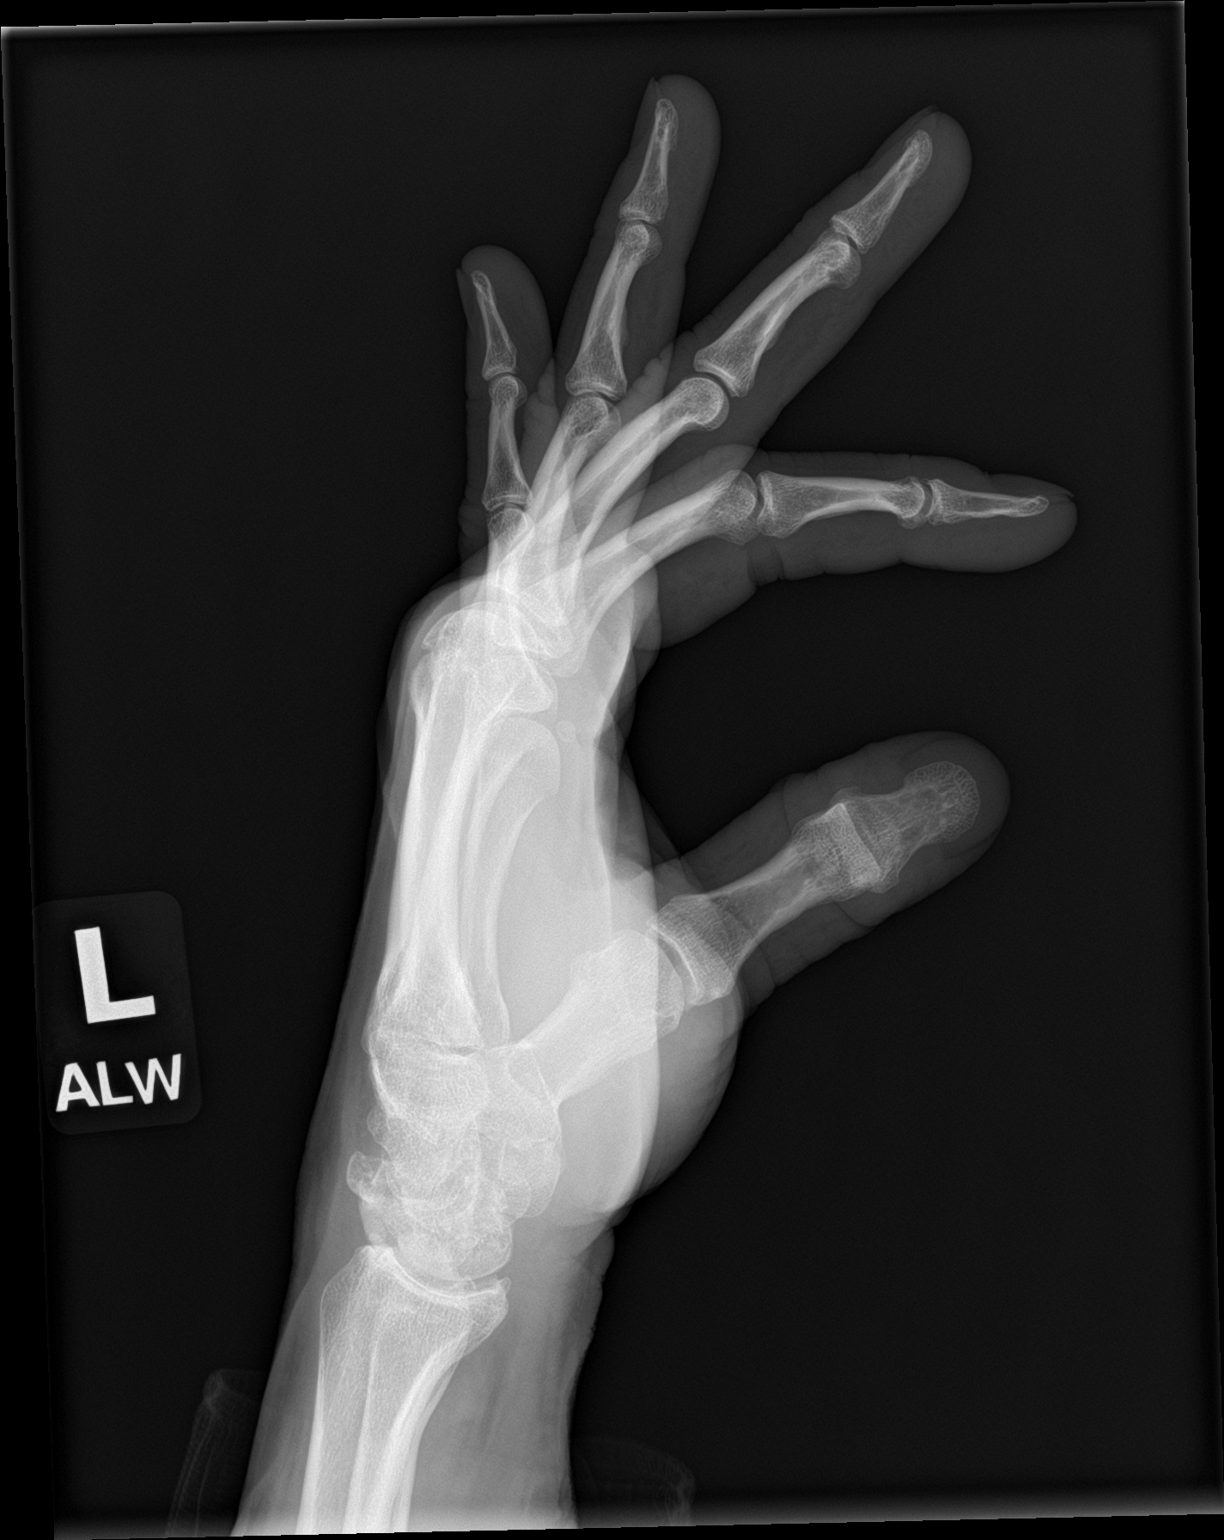

[hand obl]
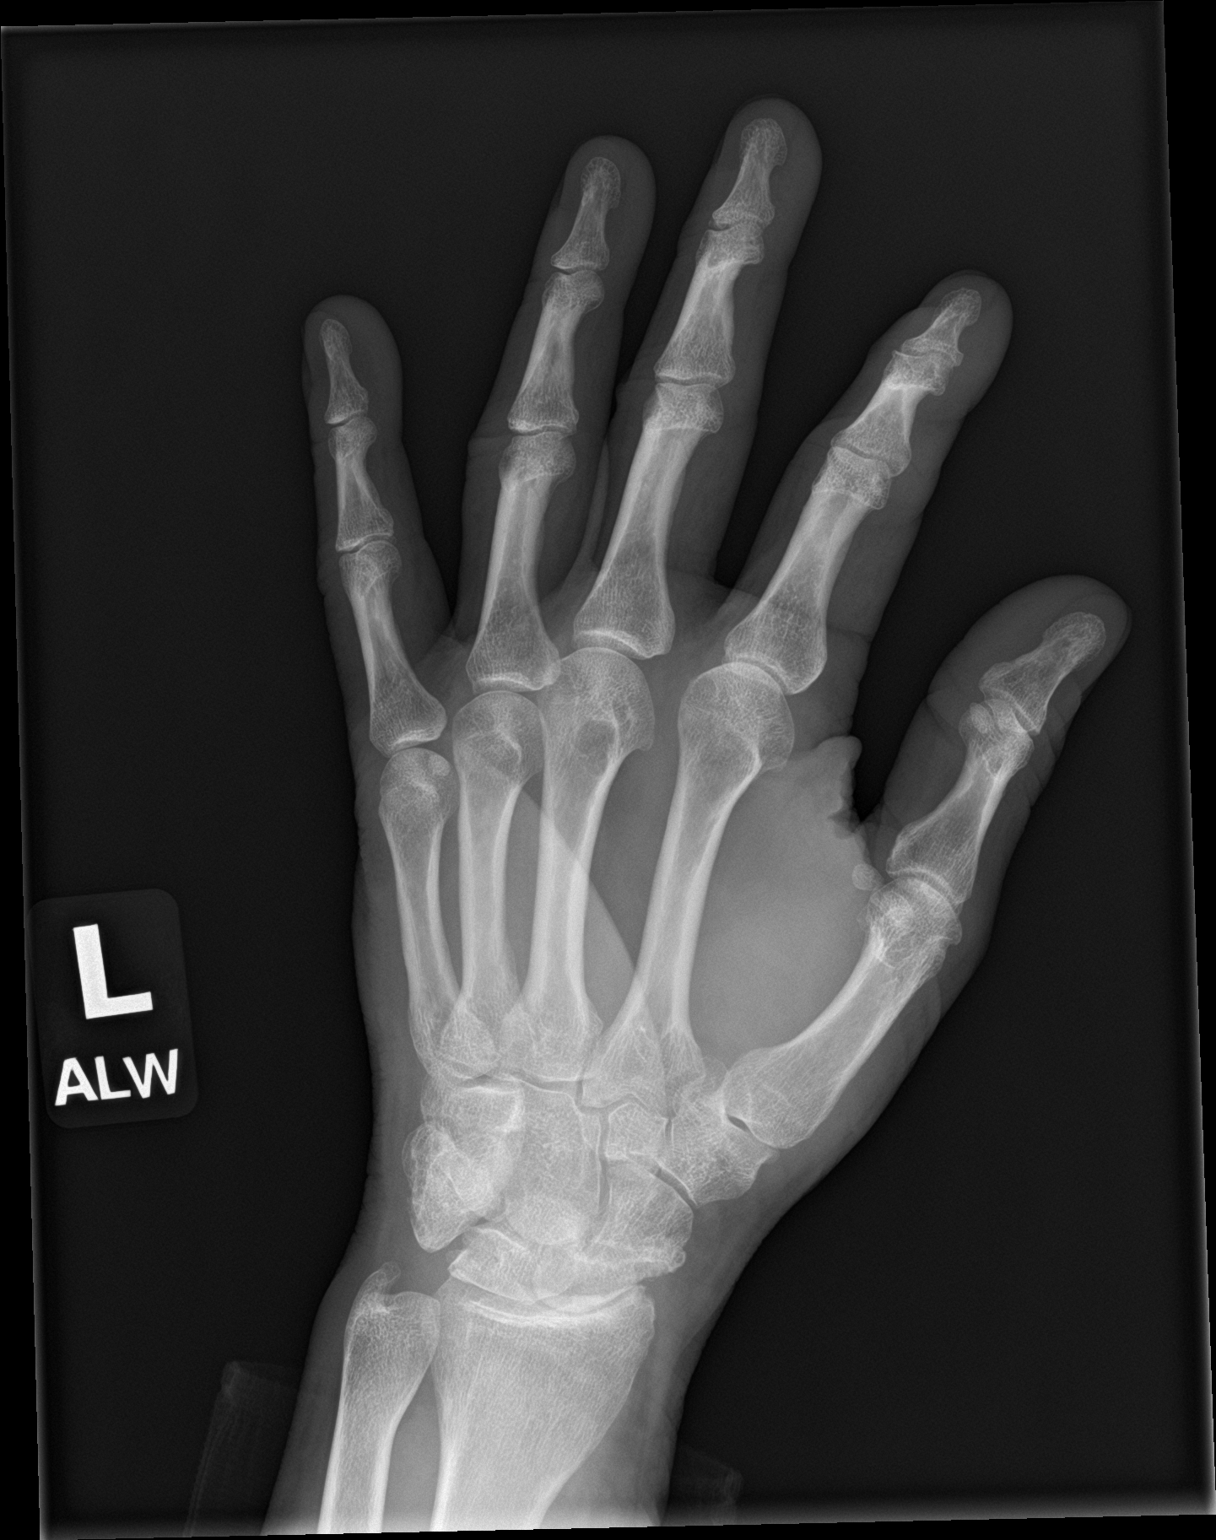

[3 of 3 positions shown; findings below may reference images not displayed]

FINDINGS: No acute fracture or dislocation. Mild degenerative changes most
pronounced at the first MCP and first CMC joints with joint space
narrowing and marginal osteophyte formation. Radiocarpal joint space
narrowing. Subchondral cysts versus erosions at the radial styloid
tip and adjacent scaphoid. Suspect marginal erosion along the ulnar
aspect of the ring finger proximal phalanx adjacent to the PIP
joint. No abnormal soft tissue mineralization. No focal soft tissue
swelling.
IMPRESSION: Mild degenerative changes of the left hand. Subchondral cysts versus
erosions at the radial styloid tip and adjacent scaphoid. Suspect
marginal erosion of the ring finger PIP joint. Findings raise
suspicion of a crystalline or inflammatory arthropathy including
rheumatoid arthritis.

## 2022-09-24 ENCOUNTER — Ambulatory Visit (INDEPENDENT_AMBULATORY_CARE_PROVIDER_SITE_OTHER): Payer: Medicare (Managed Care) | Admitting: Family

## 2022-09-24 ENCOUNTER — Encounter: Payer: Self-pay | Admitting: Family

## 2022-09-24 ENCOUNTER — Ambulatory Visit (HOSPITAL_BASED_OUTPATIENT_CLINIC_OR_DEPARTMENT_OTHER)
Admission: RE | Admit: 2022-09-24 | Discharge: 2022-09-24 | Disposition: A | Discharge status: Home / Self Care | Payer: Medicare (Managed Care) | Source: Ambulatory Visit | Attending: Family | Admitting: Family

## 2022-09-24 VITALS — BP 150/92 | HR 83 | Temp 97.6°F | Ht 69.0 in | Wt 205.0 lb

## 2022-09-24 DIAGNOSIS — M5442 Lumbago with sciatica, left side: Secondary | ICD-10-CM

## 2022-09-24 DIAGNOSIS — R030 Elevated blood-pressure reading, without diagnosis of hypertension: Secondary | ICD-10-CM | POA: Diagnosis not present

## 2022-09-24 DIAGNOSIS — M25551 Pain in right hip: Secondary | ICD-10-CM | POA: Insufficient documentation

## 2022-09-24 MED ORDER — PREDNISONE 20 MG PO TABS: ORAL_TABLET | ORAL | 0 refills | Status: DC

## 2022-09-24 MED ORDER — METHOCARBAMOL 500 MG PO TABS: 500.0000 mg | ORAL_TABLET | Freq: Three times a day (TID) | ORAL | 0 refills | Status: DC | PRN

## 2022-09-24 NOTE — Patient Instructions (Signed)
Please start checking your blood pressure and let Dr. Lorelei Pont if blood pressure is above 140/90;

## 2022-09-24 NOTE — Progress Notes (Signed)
Thomas Rosario is a 66 y.o. male with the following history as recorded in [employer]:  Patient Active Problem List   Diagnosis Date Noted   Postnasal drip 01/05/2019   HTN (hypertension) 12/22/2018   HLD (hyperlipidemia) 12/22/2018   Family history of heart attack 12/22/2018   Allergic rhinitis 02/01/2016   BCC (basal cell carcinoma of skin) 01/29/2012    Current Outpatient Medications  Medication Sig Dispense Refill   methocarbamol (ROBAXIN) 500 MG tablet Take 1 tablet (500 mg total) by mouth every 8 (eight) hours as needed for muscle spasms. 30 tablet 0   predniSONE (DELTASONE) 20 MG tablet Take 2 tablets daily x 2 days, then 1 tablet daily x 5 days 9 tablet 0   No current facility-administered medications for this visit.    Allergies: Patient has no known allergies.  Past Medical History:  Diagnosis Date   BCC (basal cell carcinoma of skin) 01/29/2012   HLD (hyperlipidemia) 12/22/2018   HTN (hypertension) 12/22/2018    No past surgical history on file.  Family History  Problem Relation Age of Onset   Early death Father    Heart attack Father     Social History   Tobacco Use   Smoking status: Never   Smokeless tobacco: Never  Substance Use Topics   Alcohol use: Yes    Subjective:   Right hip pain/ low back pain x 3 weeks; similar issues in with left hip in 2021- was treated successfully for sciatica;  Limited relief with OTC Aleve, Ibuprofen; no known injury or trauma;     Objective:  Vitals:   09/24/22 1128 09/24/22 1217  BP: (!) 150/100 (!) 150/92  Pulse: 83   Temp: 97.6 F (36.4 C)   TempSrc: Oral   SpO2: 98%   Weight: 205 lb (93 kg)   Height: '5\' 9"'$  (1.753 m)     General: Well developed, well nourished, in no acute distress  Skin : Warm and dry.  Head: Normocephalic and atraumatic  Lungs: Respirations unlabored;  Musculoskeletal: No deformities; no active joint inflammation  Extremities: No edema, cyanosis, clubbing  Vessels: Symmetric bilaterally   Neurologic: Alert and oriented; speech intact; face symmetrical; moves all extremities well; CNII-XII intact without focal deficit   Assessment:  1. Left-sided low back pain with left-sided sciatica, unspecified chronicity   2. Right hip pain   3. Elevated blood pressure reading without diagnosis of hypertension     Plan:  Update imaging to include lumbar X-ray and right hip Xray; Rx for Prednisone and Robaxin; to consider PT and/or referral to orthopedics; ? If blood pressure is up due to pain; encouraged to start monitoring blood pressure and bring log/cuff to next appointment with his PCP;   No follow-ups on file.  Orders Placed This Encounter  Procedures   DG Lumbar Spine Complete    Standing Status:   Future    Number of Occurrences:   1    Standing Expiration Date:   09/25/2023    Order Specific Question:   Reason for Exam (SYMPTOM  OR DIAGNOSIS REQUIRED)    Answer:   low back pain/ radiating symptoms    Order Specific Question:   Preferred imaging location?    Answer:   [facility]   DG Hip Unilat W OR W/O Pelvis 2-3 Views Right    Standing Status:   Future    Number of Occurrences:   1    Standing Expiration Date:   09/25/2023    Order  Specific Question:   Reason for Exam (SYMPTOM  OR DIAGNOSIS REQUIRED)    Answer:   right hip pain    Order Specific Question:   Preferred imaging location?    Answer:   [facility]    Requested Prescriptions   Signed Prescriptions Disp Refills   predniSONE (DELTASONE) 20 MG tablet 9 tablet 0    Sig: Take 2 tablets daily x 2 days, then 1 tablet daily x 5 days   methocarbamol (ROBAXIN) 500 MG tablet 30 tablet 0    Sig: Take 1 tablet (500 mg total) by mouth every 8 (eight) hours as needed for muscle spasms.

## 2022-10-11 ENCOUNTER — Encounter: Payer: Self-pay | Admitting: Family Medicine

## 2022-10-11 DIAGNOSIS — M25551 Pain in right hip: Secondary | ICD-10-CM

## 2022-10-11 NOTE — Addendum Note (Signed)
Addended by: Lamar Blinks C on: 10/11/2022 12:17 PM   Modules accepted: Orders

## 2022-10-14 ENCOUNTER — Ambulatory Visit (INDEPENDENT_AMBULATORY_CARE_PROVIDER_SITE_OTHER): Payer: Medicare (Managed Care) | Admitting: Orthopaedic Surgery

## 2022-10-14 ENCOUNTER — Encounter: Payer: Self-pay | Admitting: Orthopaedic Surgery

## 2022-10-14 DIAGNOSIS — M25551 Pain in right hip: Secondary | ICD-10-CM

## 2022-10-14 MED ORDER — METHYLPREDNISOLONE ACETATE 40 MG/ML IJ SUSP
40.0000 mg | INTRAMUSCULAR | Status: AC | PRN
  Administered 2022-10-14: 40 mg via INTRA_ARTICULAR

## 2022-10-14 MED ORDER — LIDOCAINE HCL 1 % IJ SOLN
3.0000 mL | INTRAMUSCULAR | Status: AC | PRN
  Administered 2022-10-14: 3 mL

## 2022-10-14 MED ORDER — CELECOXIB 200 MG PO CAPS: 200.0000 mg | ORAL_CAPSULE | Freq: Two times a day (BID) | ORAL | 1 refills | Status: DC | PRN

## 2022-10-14 NOTE — Progress Notes (Signed)
The patient is a very pleasant and active 66 year old gentleman who comes in with at least a month or more history of right hip pain he points to the lateral aspect of his hip but also some sciatic and groin source of his pain.  This is affecting his golf and other activities as well.  He denies any specific injuries.  He has a hard time getting comfortable sleep at night.  He has been taking ibuprofen and that does help he tried muscle relaxants and a steroid taper from his primary care physician and he did not get much of an effect of that.  He has never had surgery on hip before either.  He is not a diabetic.  He is not overweight.  His right hip does have some pain on the extremes of rotation more to the lateral aspect of the hip but some in the groin.  There is no pain over the sciatic region and no radicular component of his pain.  X-rays on the canopy system show to me and normal-appearing hip on the right side.  Both hips can be seen on the AP view and the lateral view of the right hip shows a normal joint space with no cortical irregularities and no significant arthritic findings.  I talked about trying a steroid injection with lidocaine and steroid over the point of maximal tenderness over the lateral aspect of his right hip and he agreed to this and tolerated it well.  I would like to send in Celebrex as an anti-inflammatory to try and then see him back in just 2 weeks to see how he is doing overall.  My next step would be considering an MRI of this right hip if he continues to have the pain that he is having because he is walking with a limp as well.  He agrees with this treatment plan.  All questions concerns were answered and addressed.     Procedure Note  Patient: Thomas Rosario             Date of Birth: [birth year]           MRN: [id number]             Visit Date: 10/14/2022  Procedures: Visit Diagnoses:  1. Pain of right hip     Large Joint Inj: R greater trochanter on 10/14/2022  3:22 PM Indications: pain and diagnostic evaluation Details: 22 G 1.5 in needle, lateral approach  Arthrogram: No  Medications: 3 mL lidocaine 1 %; 40 mg methylPREDNISolone acetate 40 MG/ML Outcome: tolerated well, no immediate complications Procedure, treatment alternatives, risks and benefits explained, specific risks discussed. Consent was given by the patient. Immediately prior to procedure a time out was called to verify the correct patient, procedure, equipment, support staff and site/side marked as required. Patient was prepped and draped in the usual sterile fashion.

## 2022-10-15 NOTE — Progress Notes (Signed)
[facility] [place], [place], [place] [address] [phone]  Date:  10/21/2022   Name:  Thomas Rosario   DOB:  09-May-1956   MRN:  [id number]  PCP:  Darreld Mclean, MD    Chief Complaint: 6 month follow up (Concerns/ questions: none/AWV due, HIV screen due/Flu shot today: declines)   History of Present Illness:  Thomas Rosario is a 66 y.o. very pleasant male patient who presents with the following:  Pt seen today for a 6 month recheck- History of hypertension, hyperlipidemia, skin cancer Last seen by myself 9/22 with a wrist problem  Last seen by myself for a cough in may of this year   I recently referred him to see Dr Ninfa Linden for hip pain; he is getting better in this regard  He is on celebrex twice a day and it does seem to help  Pneumonia vaccine- recommended, he is not sure  Flu shot- pt declines today Labs done in May   He is due for a derm visit and he will schedule   He is not checking his BP at home generally- he is not currently on any mediation for same   BP Readings from Last 3 Encounters:  10/21/22 124/80  09/24/22 (!) 150/92  04/17/22 124/80    Patient Active Problem List   Diagnosis Date Noted   Postnasal drip 01/05/2019   HTN (hypertension) 12/22/2018   HLD (hyperlipidemia) 12/22/2018   Family history of heart attack 12/22/2018   Allergic rhinitis 02/01/2016   BCC (basal cell carcinoma of skin) 01/29/2012    Past Medical History:  Diagnosis Date   BCC (basal cell carcinoma of skin) 01/29/2012   HLD (hyperlipidemia) 12/22/2018   HTN (hypertension) 12/22/2018    No past surgical history on file.  Social History   Tobacco Use   Smoking status: Never   Smokeless tobacco: Never  Vaping Use   Vaping Use: Never used  Substance Use Topics   Alcohol use: Yes   Drug use: Never    Family History  Problem Relation Age of Onset   Early death Father    Heart attack Father      No Known Allergies  Medication list has been reviewed and updated.  Current Outpatient Medications on File Prior to Visit  Medication Sig Dispense Refill   celecoxib (CELEBREX) 200 MG capsule Take 1 capsule (200 mg total) by mouth 2 (two) times daily as needed. 60 capsule 1   ibuprofen (ADVIL) 200 MG tablet Take 200 mg by mouth every 6 (six) hours as needed.     No current facility-administered medications on file prior to visit.    Review of Systems:  As per HPI- otherwise negative.   Physical Examination: Vitals:   10/21/22 0933  BP: 124/80  Pulse: 86  Resp: 18  Temp: 97.6 F (36.4 C)  SpO2: 96%   Vitals:   10/21/22 0933  Weight: 201 lb 3.2 oz (91.3 kg)  Height: '5\' 9"'$  (1.753 m)   Body mass index is 29.71 kg/m. Ideal Body Weight: Weight in (lb) to have BMI = 25: 168.9  GEN: no acute distress.  Overweight, looks well HEENT: Atraumatic, Normocephalic.  Ears and Nose: No external deformity. CV: RRR, No M/G/R. No JVD. No thrill. No extra heart sounds. PULM: CTA B, no wheezes, crackles, rhonchi. No retractions. No resp. distress. No accessory muscle use. ABD: S, NT, ND, +BS. No rebound. No  HSM. EXTR: No c/c/e PSYCH: Normally interactive. Conversant.    Assessment and Plan: Essential hypertension - Plan: CBC, Comprehensive metabolic panel, CT CARDIAC SCORING (SELF PAY ONLY)  Screening for diabetes mellitus - Plan: Hemoglobin A1c  Mixed hyperlipidemia - Plan: Lipid panel, CT CARDIAC SCORING (SELF PAY ONLY)  Seen today for periodic follow-up.  Blood pressures under good control today, though it is sometimes elevated.  I asked him to check his blood pressure at home once or twice a week and let me know if running higher than approximately 135/85  Order A1c today  Recommended routine immunizations, patient declines today.  I asked him to keep these in mind  We discussed a coronary calcium score, he would like to get this done.  Ordered today  56-month follow-up assuming all is well Will plan further follow- up pending labs.   Signed JLamar Blinks MD  Received labs- message to pt  Results for orders placed or performed in visit on 10/21/22  CBC  Result Value Ref Range   WBC 6.0 4.0 - 10.5 K/uL   RBC 5.20 4.22 - 5.81 Mil/uL   Platelets 346.0 150.0 - 400.0 K/uL   Hemoglobin 16.6 13.0 - 17.0 g/dL   HCT 47.9 39.0 - 52.0 %   MCV 92.1 78.0 - 100.0 fl   MCHC 34.6 30.0 - 36.0 g/dL   RDW 13.0 11.5 - 15.5 %  Comprehensive metabolic panel  Result Value Ref Range   Sodium 136 135 - 145 mEq/L   Potassium 4.0 3.5 - 5.1 mEq/L   Chloride 99 96 - 112 mEq/L   CO2 30 19 - 32 mEq/L   Glucose, Bld 89 70 - 99 mg/dL   BUN 15 6 - 23 mg/dL   Creatinine, Ser 1.02 0.40 - 1.50 mg/dL   Total Bilirubin 0.7 0.2 - 1.2 mg/dL   Alkaline Phosphatase 84 39 - 117 U/L   AST 16 0 - 37 U/L   ALT 30 0 - 53 U/L   Total Protein 6.5 6.0 - 8.3 g/dL   Albumin 4.3 3.5 - 5.2 g/dL   GFR 77.09 >60.00 mL/min   Calcium 9.4 8.4 - 10.5 mg/dL  Hemoglobin A1c  Result Value Ref Range   Hgb A1c MFr Bld 5.9 4.6 - 6.5 %  Lipid panel  Result Value Ref Range   Cholesterol 180 0 - 200 mg/dL   Triglycerides 101.0 0.0 - 149.0 mg/dL   HDL 51.00 >39.00 mg/dL   VLDL 20.2 0.0 - 40.0 mg/dL   LDL Cholesterol 109 (H) 0 - 99 mg/dL   Total CHOL/HDL Ratio 4    NonHDL 129.39

## 2022-10-15 NOTE — Patient Instructions (Addendum)
Good to see you again today- I will be in touch with your labs asap  Recommend the latest covid booster and RSV if not done already, as well as your flu and pneumonia vaccines  I ordered coronary calcium for you- please stop by the imaging dept on the way out to set this up   Please try to check your blood pressure at home once or twice a week.  I would like to see you less than 135/85 in general, please let me know if you are running higher than these parameters.  I would recommend working on more physical activity as best you can-if your hip is holding you back, please do follow-up with orthopedics.  It is okay to combine Tylenol with your Celebrex  Assuming all is well please see me in about 6 months

## 2022-10-21 ENCOUNTER — Encounter: Payer: Self-pay | Admitting: Family Medicine

## 2022-10-21 ENCOUNTER — Ambulatory Visit (HOSPITAL_BASED_OUTPATIENT_CLINIC_OR_DEPARTMENT_OTHER)
Admission: RE | Admit: 2022-10-21 | Discharge: 2022-10-21 | Disposition: A | Discharge status: Home / Self Care | Payer: Medicare (Managed Care) | Source: Ambulatory Visit | Attending: Family Medicine | Admitting: Family Medicine

## 2022-10-21 ENCOUNTER — Ambulatory Visit (INDEPENDENT_AMBULATORY_CARE_PROVIDER_SITE_OTHER): Payer: Medicare (Managed Care) | Admitting: Family Medicine

## 2022-10-21 VITALS — BP 124/80 | HR 86 | Temp 97.6°F | Resp 18 | Ht 69.0 in | Wt 201.2 lb

## 2022-10-21 DIAGNOSIS — E782 Mixed hyperlipidemia: Secondary | ICD-10-CM

## 2022-10-21 DIAGNOSIS — I10 Essential (primary) hypertension: Secondary | ICD-10-CM

## 2022-10-21 DIAGNOSIS — Z131 Encounter for screening for diabetes mellitus: Secondary | ICD-10-CM

## 2022-10-21 DIAGNOSIS — R7303 Prediabetes: Secondary | ICD-10-CM | POA: Insufficient documentation

## 2022-10-21 LAB — COMPREHENSIVE METABOLIC PANEL
ALT: 30 U/L (ref 0–53)
AST: 16 U/L (ref 0–37)
Albumin: 4.3 g/dL (ref 3.5–5.2)
Alkaline Phosphatase: 84 U/L (ref 39–117)
BUN: 15 mg/dL (ref 6–23)
CO2: 30 mEq/L (ref 19–32)
Calcium: 9.4 mg/dL (ref 8.4–10.5)
Chloride: 99 mEq/L (ref 96–112)
Creatinine, Ser: 1.02 mg/dL (ref 0.40–1.50)
GFR: 77.09 mL/min (ref >60.00)
Glucose, Bld: 89 mg/dL (ref 70–99)
Potassium: 4 mEq/L (ref 3.5–5.1)
Sodium: 136 mEq/L (ref 135–145)
Total Bilirubin: 0.7 mg/dL (ref 0.2–1.2)
Total Protein: 6.5 g/dL (ref 6.0–8.3)

## 2022-10-21 LAB — CBC
HCT: 47.9 % (ref 39.0–52.0)
Hemoglobin: 16.6 g/dL (ref 13.0–17.0)
MCHC: 34.6 g/dL (ref 30.0–36.0)
MCV: 92.1 fl (ref 78.0–100.0)
Platelets: 346 10*3/uL (ref 150.0–400.0)
RBC: 5.2 Mil/uL (ref 4.22–5.81)
RDW: 13 % (ref 11.5–15.5)
WBC: 6 10*3/uL (ref 4.0–10.5)

## 2022-10-21 LAB — LIPID PANEL
Cholesterol: 180 mg/dL (ref 0–200)
HDL: 51 mg/dL (ref >39.00)
LDL Cholesterol: 109 mg/dL — ABNORMAL HIGH (ref 0–99)
NonHDL: 129.39
Total CHOL/HDL Ratio: 4
Triglycerides: 101 mg/dL (ref 0.0–149.0)
VLDL: 20.2 mg/dL (ref 0.0–40.0)

## 2022-10-21 LAB — HEMOGLOBIN A1C: Hgb A1c MFr Bld: 5.9 % (ref 4.6–6.5)

## 2022-10-22 ENCOUNTER — Encounter: Payer: Self-pay | Admitting: Family Medicine

## 2022-10-22 DIAGNOSIS — R931 Abnormal findings on diagnostic imaging of heart and coronary circulation: Secondary | ICD-10-CM

## 2022-10-22 DIAGNOSIS — I77810 Thoracic aortic ectasia: Secondary | ICD-10-CM | POA: Insufficient documentation

## 2022-10-30 ENCOUNTER — Encounter: Payer: Self-pay | Admitting: Orthopaedic Surgery

## 2022-10-30 ENCOUNTER — Ambulatory Visit (INDEPENDENT_AMBULATORY_CARE_PROVIDER_SITE_OTHER): Payer: Medicare (Managed Care) | Admitting: Orthopaedic Surgery

## 2022-10-30 DIAGNOSIS — M25551 Pain in right hip: Secondary | ICD-10-CM | POA: Diagnosis not present

## 2022-10-30 NOTE — Progress Notes (Signed)
The patient comes in with continued right hip pain.  He is only 66 years old and he has been having pain in his hip and groin for about 2 to 3 months now.  At his last visit I did provide a steroid injection over the trochanteric area of his right hip and that did help some but he still having groin pain.  His x-rays show no obvious abnormalities of the right hip joint.  On exam he still does have some stiffness and pain in the groin with extremes of internal and external rotation of his right hip.  At this point I would like to send him to one of my partners for an image guided steroid injection in his right hip joint.  I explained why I would like to have this done and the patient agrees with trying this as well before pursuing a MRI of the hip.  We will work on getting that injection set up and then whoever provides the injection and then get him back to me about 2 weeks later.  The patient agrees with this treatment plan.

## 2022-10-30 NOTE — Addendum Note (Signed)
Addended by: Robyne Peers on: 10/30/2022 04:59 PM   Modules accepted: Orders

## 2022-11-01 MED ORDER — ROSUVASTATIN CALCIUM 20 MG PO TABS: 20.0000 mg | ORAL_TABLET | Freq: Every day | ORAL | 3 refills | Status: DC

## 2022-11-04 ENCOUNTER — Ambulatory Visit: Payer: Medicare (Managed Care) | Admitting: Sports Medicine

## 2022-11-08 ENCOUNTER — Encounter: Payer: Self-pay | Admitting: Sports Medicine

## 2022-11-08 ENCOUNTER — Ambulatory Visit: Payer: Medicare (Managed Care) | Admitting: Sports Medicine

## 2022-11-08 ENCOUNTER — Ambulatory Visit: Payer: Self-pay

## 2022-11-08 DIAGNOSIS — M25551 Pain in right hip: Secondary | ICD-10-CM

## 2022-11-08 MED ORDER — METHYLPREDNISOLONE ACETATE 40 MG/ML IJ SUSP
80.0000 mg | INTRAMUSCULAR | Status: AC | PRN
  Administered 2022-11-08: 80 mg via INTRA_ARTICULAR

## 2022-11-08 MED ORDER — LIDOCAINE HCL 1 % IJ SOLN
4.0000 mL | INTRAMUSCULAR | Status: AC | PRN
  Administered 2022-11-08: 4 mL

## 2022-11-08 NOTE — Progress Notes (Signed)
   Procedure Note  Patient: Thomas Rosario             Date of Birth: 11-22-1956           MRN: [id number]             Visit Date: 11/08/2022  Procedures: Visit Diagnoses:  1. Pain of right hip     Large Joint Inj: R hip joint on 11/08/2022 9:26 AM Indications: pain Details: 22 G 3.5 in needle, ultrasound-guided anterior approach Medications: 4 mL lidocaine 1 %; 80 mg methylPREDNISolone acetate 40 MG/ML Outcome: tolerated well, no immediate complications  Procedure: US-guided intra-articular hip injection, right After discussion on risks/benefits/indications and informed verbal consent was obtained, a timeout was performed. Patient was lying supine on exam table. The hip was cleaned with betadine and alcohol swabs. Then utilizing ultrasound guidance, the patient's femoral head and neck junction was identified and subsequently injected with 4:2 lidocaine:depomedrol via an in-plane approach with ultrasound visualization of the injectate administered into the hip joint. Patient tolerated procedure well without immediate complications.  Procedure, treatment alternatives, risks and benefits explained, specific risks discussed. Consent was given by the patient. Immediately prior to procedure a time out was called to verify the correct patient, procedure, equipment, support staff and site/side marked as required. Patient was prepped and draped in the usual sterile fashion.    - I evaluated the patient about 10 minutes post-injection and he had good improvement in pain and range of motion - follow-up with Dr. Ninfa Linden as indicated; I am happy to see them as needed  Elba Barman, DO [place]  This note was dictated using Dragon naturally speaking software and may contain errors in syntax, spelling, or content which have not been identified prior to signing this note.

## 2022-12-04 ENCOUNTER — Ambulatory Visit (INDEPENDENT_AMBULATORY_CARE_PROVIDER_SITE_OTHER): Payer: Medicare (Managed Care) | Admitting: Orthopaedic Surgery

## 2022-12-04 ENCOUNTER — Other Ambulatory Visit: Payer: Self-pay

## 2022-12-04 DIAGNOSIS — M25551 Pain in right hip: Secondary | ICD-10-CM

## 2022-12-04 NOTE — Progress Notes (Signed)
The patient comes in today with continued right hip pain and groin pain.  His x-rays were obtained recently showed normal-appearing hips bilaterally.  I tried his trochanteric injection which did not really provide relief.  I then sent him to my partner Dr. Rolena Infante who did perform an ultrasound-guided intra-articular injection.  The patient has not got any relief from that either.  This is been getting worse for 5 months now.  He has been on several different anti-inflammatories and muscle relaxants.  He is now walking with a limp.  He still gets a stabbing pain in his hip and groin area.  He has remote history of hernia surgery but he is not sure which side.  On exam his left hip exam is entirely normal.  The right hip shows severe pain with internal and external rotation.  At this point a MRI arthrogram is warranted of the right hip to assess for labral tear and to assess the cartilage or any other pathology in the hip and groin area that it is contributing to his pain.  At this point it is medically necessary given the failure conservative treatment for getting close to 6 months now.  All question concerns were answered and addressed.  Will see him in follow-up after this MRI.

## 2022-12-25 ENCOUNTER — Ambulatory Visit
Admission: RE | Admit: 2022-12-25 | Discharge: 2022-12-25 | Disposition: A | Discharge status: Home / Self Care | Payer: Medicare (Managed Care) | Source: Ambulatory Visit | Attending: Orthopaedic Surgery | Admitting: Orthopaedic Surgery

## 2022-12-25 DIAGNOSIS — R600 Localized edema: Secondary | ICD-10-CM | POA: Diagnosis not present

## 2022-12-25 DIAGNOSIS — M25551 Pain in right hip: Secondary | ICD-10-CM | POA: Diagnosis not present

## 2022-12-25 MED ORDER — IOPAMIDOL (ISOVUE-M 200) INJECTION 41%
15.0000 mL | Freq: Once | INTRAMUSCULAR | Status: AC
  Administered 2022-12-25: 15 mL via INTRA_ARTICULAR

## 2022-12-30 ENCOUNTER — Ambulatory Visit (INDEPENDENT_AMBULATORY_CARE_PROVIDER_SITE_OTHER): Payer: Medicare (Managed Care) | Admitting: Orthopaedic Surgery

## 2022-12-30 ENCOUNTER — Encounter: Payer: Self-pay | Admitting: Orthopaedic Surgery

## 2022-12-30 DIAGNOSIS — M1611 Unilateral primary osteoarthritis, right hip: Secondary | ICD-10-CM | POA: Insufficient documentation

## 2022-12-30 DIAGNOSIS — M25551 Pain in right hip: Secondary | ICD-10-CM | POA: Diagnosis not present

## 2022-12-30 NOTE — Progress Notes (Signed)
The patient is an active and young appearing 67 year old stoic individual who comes in to go over an MRI of his right hip.  He had been dealing with hip pain for several months now and has been getting progressively worse.  He had tried conservative treatment including activity modification and rest as well as hip strengthening exercises.  We then sent him for an intra-articular steroid injection in the right hip joint and this did not help.  He is having worsening pain and he started to walk with a limp.  He has not been able to do some of the activities that he likes to do because of his right hip pain and groin pain.  It is waking at night at this point.  He is here to go over an MRI of his right hip.  An MRI of his right hip was reviewed with him.  It shows advanced arthritis of the right hip with areas of full-thickness cartilage loss of the weightbearing surface of the femoral head and acetabulum.  There is degenerative tearing of the superior and anterior labrum.  There is also significant subchondral edema in the femoral head and it appears that there may be a stress fracture and component of this as well.  The edema runs into the femoral neck as well.  At this point I am recommending a total hip arthroplasty.  Physical therapy will not help at this point given the severity of the MRI findings of the right hip and this could actually make it worse going to physical therapy given the edema in the femoral head neck.  He has full-thickness cartilage loss.  I went over a hip replacement with him in detail describing what the surgery involves.  We talked about the intraoperative and postoperative course and what to expect with the risk and benefits of surgery.  I did give him a handout about hip replacement surgery as well.  We will work on getting this scheduled.  All questions and concerns were answered and addressed.

## 2023-01-06 ENCOUNTER — Ambulatory Visit: Payer: Medicare (Managed Care) | Admitting: Orthopaedic Surgery

## 2023-01-21 ENCOUNTER — Other Ambulatory Visit: Payer: Self-pay

## 2023-02-13 ENCOUNTER — Telehealth: Payer: Self-pay | Admitting: Family Medicine

## 2023-02-13 NOTE — Telephone Encounter (Signed)
Copied from [place] [id number]. Topic: Medicare AWV >> Feb 13, 2023 10:17 AM Devoria Glassing wrote: Reason for CRM: Called patient to schedule Medicare Annual Wellness Visit (AWV). Left message for patient to call back and schedule Medicare Annual Wellness Visit (AWV).  Last date of AWV: NONE   Please schedule an appointment at any time with Beatris Ship, CMA after 02/24/23 on AWV schedule  .  If any questions, please contact me.  Thank you ,  Sherol Dade; [place] Direct Dial: [phone]

## 2023-02-17 DIAGNOSIS — Z85828 Personal history of other malignant neoplasm of skin: Secondary | ICD-10-CM | POA: Diagnosis not present

## 2023-02-17 DIAGNOSIS — L821 Other seborrheic keratosis: Secondary | ICD-10-CM | POA: Diagnosis not present

## 2023-02-18 ENCOUNTER — Telehealth: Payer: Self-pay | Admitting: Orthopaedic Surgery

## 2023-02-18 NOTE — Telephone Encounter (Signed)
Patient just had some basic questions about surgery; those were answered

## 2023-02-18 NOTE — Telephone Encounter (Signed)
Patient asking to speak to Someone about what to expect after surgery. Please call

## 2023-02-24 NOTE — Progress Notes (Signed)
Sent message, via epic in basket, requesting orders in epic from surgeon.  

## 2023-02-25 NOTE — Progress Notes (Signed)
COVID Vaccine Completed: yes  Date of COVID positive in last 90 days:  PCP - Lamar Blinks, MD Cardiologist -   Chest x-ray -  EKG -  Stress Test -  ECHO -  Cardiac Cath -  Pacemaker/ICD device last checked: Spinal Cord Stimulator:  Bowel Prep -   Sleep Study -  CPAP -   Fasting Blood Sugar - pre DM Checks Blood Sugar _____ times a day  Last dose of GLP1 agonist-  N/A GLP1 instructions:  N/A   Last dose of SGLT-2 inhibitors-  N/A SGLT-2 instructions: N/A   Blood Thinner Instructions: Aspirin Instructions: Last Dose:  Activity level:  Can go up a flight of stairs and perform activities of daily living without stopping and without symptoms of chest pain or shortness of breath.  Able to exercise without symptoms  Unable to go up a flight of stairs without symptoms of     Anesthesia review:   Patient denies shortness of breath, fever, cough and chest pain at PAT appointment  Patient verbalized understanding of instructions that were given to them at the PAT appointment. Patient was also instructed that they will need to review over the PAT instructions again at home before surgery.

## 2023-02-25 NOTE — Patient Instructions (Signed)
SURGICAL WAITING ROOM VISITATION  Patients having surgery or a procedure may have no more than 2 support people in the waiting area - these visitors may rotate.    Children under the age of 53 must have an adult with them who is not the patient.  Due to an increase in RSV and influenza rates and associated hospitalizations, children ages 66 and under may not visit patients in [place].  If the patient needs to stay at the hospital during part of their recovery, the visitor guidelines for inpatient rooms apply. Pre-op nurse will coordinate an appropriate time for 1 support person to accompany patient in pre-op.  This support person may not rotate.    Please refer to the [facility] website for the visitor guidelines for Inpatients (after your surgery is over and you are in a regular room).    Your procedure is scheduled on: 03/07/23   Report to [facility] Main Entrance    Report to admitting at 7:45 AM   Call this number if you have problems the morning of surgery [phone]   Do not eat food :After Midnight.   After Midnight you may have the following liquids until 7:15 AM DAY OF SURGERY  Water Non-Citrus Juices (without pulp, NO RED-Apple, White grape, White cranberry) Black Coffee (NO MILK/CREAM OR CREAMERS, sugar ok)  Clear Tea (NO MILK/CREAM OR CREAMERS, sugar ok) regular and decaf                             Plain Jell-O (NO RED)                                           Fruit ices (not with fruit pulp, NO RED)                                     Popsicles (NO RED)                                                               Sports drinks like Gatorade (NO RED)     The day of surgery:  Drink ONE (1) Pre-Surgery Clear Ensure at 7:15 AM the morning of surgery. Drink in one sitting. Do not sip.  This drink was given to you during your hospital  pre-op appointment visit. Nothing else to drink after completing the  Pre-Surgery Clear Ensure.           If you have questions, please contact your surgeon's office.   FOLLOW BOWEL PREP AND ANY ADDITIONAL PRE OP INSTRUCTIONS YOU RECEIVED FROM YOUR SURGEON'S OFFICE!!!     Oral Hygiene is also important to reduce your risk of infection.                                    Remember - BRUSH YOUR TEETH THE MORNING OF SURGERY WITH YOUR REGULAR TOOTHPASTE  DENTURES WILL BE REMOVED PRIOR TO SURGERY PLEASE DO NOT APPLY "Poly grip" OR  ADHESIVES!!!   Take these medicines the morning of surgery with A SIP OF WATER: Claritin, Rosuvastatin                               You may not have any metal on your body including jewelry, and body piercing             Do not wear lotions, powders, cologne, or deodorant  Do not shave  48 hours prior to surgery.               Men may shave face and neck.   Do not bring valuables to the hospital. [place].   Contacts, glasses, dentures or bridgework may not be worn into surgery.   Bring small overnight bag day of surgery.   DO NOT [place]. PHARMACY WILL DISPENSE MEDICATIONS LISTED ON YOUR MEDICATION LIST TO YOU DURING YOUR ADMISSION [place]!   Special Instructions: Bring a copy of your healthcare power of attorney and living will documents the day of surgery if you haven't scanned them before.              Please read over the following fact sheets you were given: IF [place] [phone] Schneiders   If you received a COVID test during your pre-op visit  it is requested that you wear a mask when out in public, stay away from anyone that may not be feeling well and notify your surgeon if you develop symptoms. If you test positive for Covid or have been in contact with anyone that has tested positive in the last 10 days please notify you surgeon.  FAILURE TO FOLLOW THESE INSTRUCTIONS MAY RESULT IN THE CANCELLATION OF  YOUR SURGERY  PATIENT SIGNATURE_________________________________  NURSE SIGNATURE__________________________________  ________________________________________________________________________   Adam Phenix  An incentive spirometer is a tool that can help keep your lungs clear and active. This tool measures how well you are filling your lungs with each breath. Taking long deep breaths may help reverse or decrease the chance of developing breathing (pulmonary) problems (especially infection) following: A long period of time when you are unable to move or be active. BEFORE THE PROCEDURE  If the spirometer includes an indicator to show your best effort, your nurse or respiratory therapist will set it to a desired goal. If possible, sit up straight or lean slightly forward. Try not to slouch. Hold the incentive spirometer in an upright position. INSTRUCTIONS FOR USE  Sit on the edge of your bed if possible, or sit up as far as you can in bed or on a chair. Hold the incentive spirometer in an upright position. Breathe out normally. Place the mouthpiece in your mouth and seal your lips tightly around it. Breathe in slowly and as deeply as possible, raising the piston or the ball toward the top of the column. Hold your breath for 3-5 seconds or for as long as possible. Allow the piston or ball to fall to the bottom of the column. Remove the mouthpiece from your mouth and breathe out normally. Rest for a few seconds and repeat Steps 1 through 7 at least 10 times every 1-2 hours when you are awake. Take your time and take a few normal breaths between deep breaths. The  spirometer may include an indicator to show your best effort. Use the indicator as a goal to work toward during each repetition. After each set of 10 deep breaths, practice coughing to be sure your lungs are clear. If you have an incision (the cut made at the time of surgery), support your incision when coughing by placing a  pillow or rolled up towels firmly against it. Once you are able to get out of bed, walk around indoors and cough well. You may stop using the incentive spirometer when instructed by your caregiver.  RISKS AND COMPLICATIONS Take your time so you do not get dizzy or light-headed. If you are in pain, you may need to take or ask for pain medication before doing incentive spirometry. It is harder to take a deep breath if you are having pain. AFTER USE Rest and breathe slowly and easily. It can be helpful to keep track of a log of your progress. Your caregiver can provide you with a simple table to help with this. If you are using the spirometer at home, follow these instructions: [place] IF:  You are having difficultly using the spirometer. You have trouble using the spirometer as often as instructed. Your pain medication is not giving enough relief while using the spirometer. You develop fever of 100.5 F (38.1 C) or higher. SEEK IMMEDIATE MEDICAL CARE IF:  You cough up bloody sputum that had not been present before. You develop fever of 102 F (38.9 C) or greater. You develop worsening pain at or near the incision site. MAKE SURE YOU:  Understand these instructions. Will watch your condition. Will get help right away if you are not doing well or get worse. Document Released: 03/24/2007 Document Revised: 02/03/2012 Document Reviewed: 05/25/2007 [facility] Patient Information 2014 [place], [place].   ________________________________________________________________________

## 2023-02-25 NOTE — Progress Notes (Signed)
Please place orders for PAT appointment scheduled 02/26/23.

## 2023-02-26 ENCOUNTER — Encounter (HOSPITAL_COMMUNITY): Payer: Self-pay

## 2023-02-26 ENCOUNTER — Other Ambulatory Visit: Payer: Self-pay

## 2023-02-26 ENCOUNTER — Encounter (HOSPITAL_COMMUNITY)
Admission: RE | Admit: 2023-02-26 | Discharge: 2023-02-26 | Disposition: A | Discharge status: Home / Self Care | Payer: Medicare (Managed Care) | Source: Ambulatory Visit | Attending: Orthopaedic Surgery | Admitting: Orthopaedic Surgery

## 2023-02-26 VITALS — BP 146/101 | HR 90 | Temp 97.6°F | Resp 12 | Ht 69.0 in | Wt 197.0 lb

## 2023-02-26 DIAGNOSIS — R7303 Prediabetes: Secondary | ICD-10-CM

## 2023-02-26 DIAGNOSIS — Z01818 Encounter for other preprocedural examination: Secondary | ICD-10-CM | POA: Diagnosis not present

## 2023-02-26 DIAGNOSIS — I10 Essential (primary) hypertension: Secondary | ICD-10-CM | POA: Diagnosis not present

## 2023-02-26 HISTORY — DX: Unspecified osteoarthritis, unspecified site: M19.90

## 2023-02-26 LAB — BASIC METABOLIC PANEL
Anion gap: 8 (ref 5–15)
BUN: 12 mg/dL (ref 8–23)
CO2: 24 mmol/L (ref 22–32)
Calcium: 9.1 mg/dL (ref 8.9–10.3)
Chloride: 106 mmol/L (ref 98–111)
Creatinine, Ser: 0.95 mg/dL (ref 0.61–1.24)
GFR, Estimated: >60 mL/min (ref >60)
Glucose, Bld: 100 mg/dL — ABNORMAL HIGH (ref 70–99)
Potassium: 4.1 mmol/L (ref 3.5–5.1)
Sodium: 138 mmol/L (ref 135–145)

## 2023-02-26 LAB — CBC
HCT: 46.3 % (ref 39.0–52.0)
Hemoglobin: 15.9 g/dL (ref 13.0–17.0)
MCH: 30.9 pg (ref 26.0–34.0)
MCHC: 34.3 g/dL (ref 30.0–36.0)
MCV: 90.1 fL (ref 80.0–100.0)
Platelets: 242 10*3/uL (ref 150–400)
RBC: 5.14 MIL/uL (ref 4.22–5.81)
RDW: 12.1 % (ref 11.5–15.5)
WBC: 5.5 10*3/uL (ref 4.0–10.5)
nRBC: 0 % (ref 0.0–0.2)

## 2023-02-26 LAB — SURGICAL PCR SCREEN
MRSA, PCR: NEGATIVE
Staphylococcus aureus: POSITIVE — AB

## 2023-02-26 LAB — GLUCOSE, CAPILLARY: Glucose-Capillary: 103 mg/dL — ABNORMAL HIGH (ref 70–99)

## 2023-02-26 NOTE — Progress Notes (Signed)
[facility] + results routed to Dr. Blackman 

## 2023-02-27 LAB — HEMOGLOBIN A1C
Hgb A1c MFr Bld: 5.7 % — ABNORMAL HIGH (ref 4.8–5.6)
Mean Plasma Glucose: 117 mg/dL

## 2023-03-03 NOTE — Progress Notes (Signed)
Case: [id number] Date/Time: 03/07/23 1000   Procedure: RIGHT TOTAL HIP ARTHROPLASTY ANTERIOR APPROACH (Right: Hip)   Anesthesia type: Spinal   Pre-op diagnosis: OSTEOARTHRITIS/ DEGENERATIVE JOINT DISEASE RIGHT HIP   Location: [facility] ROOM 09 / [facility]   Surgeons: Kathryne Hitch, MD       DISCUSSION: Thomas Rosario is a 67 yo male, never smoker, who presents for above surgery on 03/07/23. Patient with PMH of HTN, HLD, prediabetes, arthritis. No hx of prior surgeries/anesthesia encounters in [facility].   #HTN -Followed by PCP. Currently does not take BP meds. BP elevated at PAT visit. Pt counseled to monitor BP at home that there is risk of surgery being cancelled if uncontrolled DOS.     02/26/2023    9:07 AM 10/21/2022    9:33 AM 09/24/2022   12:17 PM  Vitals with BMI  Height 5\' 9"  5\' 9"    Weight 197 lbs 201 lbs 3 oz   BMI 29.08 29.7   Systolic 146 124 859  Diastolic 101 80 92  Pulse 90 86     #Prediabetes -HgA1c 5.7 on 02/26/23  VS: BP (!) 146/101   Pulse 90   Temp 36.4 C (Oral)   Resp 12   Ht 5\' 9"  (1.753 m)   Wt 89.4 kg   SpO2 98%   BMI 29.09 kg/m   PROVIDERS: Copland, Gwenlyn Found, MD   LABS: Labs reviewed: Acceptable for surgery. (all labs ordered are listed, but only abnormal results are displayed)  Labs Reviewed  SURGICAL PCR SCREEN - Abnormal; Notable for the following components:      Result Value   Staphylococcus aureus POSITIVE (*)    All other components within normal limits  HEMOGLOBIN A1C - Abnormal; Notable for the following components:   Hgb A1c MFr Bld 5.7 (*)    All other components within normal limits  BASIC METABOLIC PANEL - Abnormal; Notable for the following components:   Glucose, Bld 100 (*)    All other components within normal limits  GLUCOSE, CAPILLARY - Abnormal; Notable for the following components:   Glucose-Capillary 103 (*)    All other components within normal limits  CBC  TYPE AND SCREEN     IMAGES: n/a   CV:  EKG 02/26/23:  NSR   Past Medical History:  Diagnosis Date   Arthritis    BCC (basal cell carcinoma of skin) 01/29/2012   HLD (hyperlipidemia) 12/22/2018   HTN (hypertension) 12/22/2018    History reviewed. No pertinent surgical history.  MEDICATIONS:  celecoxib (CELEBREX) 200 MG capsule   loratadine (CLARITIN) 10 MG tablet   naproxen sodium (ALEVE) 220 MG tablet   rosuvastatin (CRESTOR) 20 MG tablet   No current facility-administered medications for this encounter.    Marcille Blanco [facility] Short Stay/Anesthesiology [facility] Phone [phone]  03/03/2023 10:50 AM

## 2023-03-06 NOTE — Anesthesia Preprocedure Evaluation (Addendum)
Anesthesia Evaluation  Patient identified by MRN, date of birth, ID band Patient awake    Reviewed: Allergy & Precautions, NPO status , Patient's Chart, lab work & pertinent test results  History of Anesthesia Complications Negative for: history of anesthetic complications  Airway Mallampati: II  TM Distance: >3 FB Neck ROM: Full    Dental no notable dental hx.    Pulmonary neg pulmonary ROS   Pulmonary exam normal        Cardiovascular hypertension, + CAD  Normal cardiovascular exam     Neuro/Psych negative neurological ROS  negative psych ROS   GI/Hepatic negative GI ROS, Neg liver ROS,,,  Endo/Other  negative endocrine ROS    Renal/GU negative Renal ROS     Musculoskeletal  (+) Arthritis ,    Abdominal   Peds  Hematology negative hematology ROS (+)   Anesthesia Other Findings Day of surgery medications reviewed with patient.  Reproductive/Obstetrics                              Anesthesia Physical Anesthesia Plan  ASA: 2  Anesthesia Plan: Spinal   Post-op Pain Management: Tylenol PO (pre-op)* and Regional block*   Induction:   PONV Risk Score and Plan: 2 and Treatment may vary due to age or medical condition, Ondansetron, Propofol infusion and Dexamethasone  Airway Management Planned: Natural Airway and Simple Face Mask  Additional Equipment:   Intra-op Plan:   Post-operative Plan:   Informed Consent: I have reviewed the patients History and Physical, chart, labs and discussed the procedure including the risks, benefits and alternatives for the proposed anesthesia with the patient or authorized representative who has indicated his/her understanding and acceptance.     Dental advisory given  Plan Discussed with: CRNA  Anesthesia Plan Comments:         Anesthesia Quick Evaluation

## 2023-03-06 NOTE — H&P (Signed)
TOTAL HIP ADMISSION H&P  Patient is admitted for right total hip arthroplasty.  Subjective:  Chief Complaint: right hip pain  HPI: Thomas Rosario, 67 y.o. male, has a history of pain and functional disability in the right hip(s) due to arthritis and patient has failed non-surgical conservative treatments for greater than 12 weeks to include NSAID's and/or analgesics, corticosteriod injections, and activity modification.  Onset of symptoms was abrupt starting 6-7 months ago with rapidlly worsening course since that time.The patient noted no past surgery on the right hip(s).  Patient currently rates pain in the right hip at 10 out of 10 with activity. Patient has night pain, worsening of pain with activity and weight bearing, pain that interfers with activities of daily living, and pain with passive range of motion. Patient has evidence of  subchondral edema and advanced OA of the right hip  by imaging studies (MRI). This condition presents safety issues increasing the risk of falls.  There is no current active infection.  Patient Active Problem List   Diagnosis Date Noted   Unilateral primary osteoarthritis, right hip 12/30/2022   Elevated coronary artery calcium score 10/22/2022   Ascending aorta dilatation 10/22/2022   Prediabetes 10/21/2022   Postnasal drip 01/05/2019   HTN (hypertension) 12/22/2018   HLD (hyperlipidemia) 12/22/2018   Family history of heart attack 12/22/2018   Allergic rhinitis 02/01/2016   BCC (basal cell carcinoma of skin) 01/29/2012   Past Medical History:  Diagnosis Date   Arthritis    BCC (basal cell carcinoma of skin) 01/29/2012   HLD (hyperlipidemia) 12/22/2018   HTN (hypertension) 12/22/2018    No past surgical history on file.  No current facility-administered medications for this encounter.   Current Outpatient Medications  Medication Sig Dispense Refill Last Dose   celecoxib (CELEBREX) 200 MG capsule Take 1 capsule (200 mg total) by mouth 2 (two) times  daily as needed. (Patient taking differently: Take 200 mg by mouth daily.) 60 capsule 1    loratadine (CLARITIN) 10 MG tablet Take 10 mg by mouth daily.      naproxen sodium (ALEVE) 220 MG tablet Take 220 mg by mouth daily.      rosuvastatin (CRESTOR) 20 MG tablet Take 1 tablet (20 mg total) by mouth daily. 90 tablet 3    No Known Allergies  Social History   Tobacco Use   Smoking status: Never   Smokeless tobacco: Never  Substance Use Topics   Alcohol use: Yes    Alcohol/week: 6.0 standard drinks of alcohol    Types: 6 Glasses of wine per week    Family History  Problem Relation Age of Onset   Early death Father    Heart attack Father      Review of Systems  Objective:  Physical Exam Vitals reviewed.  Constitutional:      Appearance: Normal appearance. He is normal weight.  HENT:     Head: Normocephalic and atraumatic.  Eyes:     Extraocular Movements: Extraocular movements intact.     Pupils: Pupils are equal, round, and reactive to light.  Cardiovascular:     Rate and Rhythm: Normal rate and regular rhythm.     Pulses: Normal pulses.  Pulmonary:     Effort: Pulmonary effort is normal.     Breath sounds: Normal breath sounds.  Abdominal:     Palpations: Abdomen is soft.  Musculoskeletal:     Cervical back: Normal range of motion and neck supple.     Right hip: Tenderness  and bony tenderness present. Decreased range of motion. Decreased strength.  Neurological:     Mental Status: He is alert and oriented to person, place, and time.  Psychiatric:        Behavior: Behavior normal.     Vital signs in last 24 hours:    Labs:   Estimated body mass index is 29.09 kg/m as calculated from the following:   Height as of 02/26/23: 5\' 9"  (1.753 m).   Weight as of 02/26/23: 89.4 kg.   Imaging Review MRI demonstrates severe degenerative joint disease of the right hip(s). The bone quality appears to be excellent for age and reported activity  level.      Assessment/Plan:  End stage arthritis, right hip(s)  The patient history, physical examination, clinical judgement of the provider and imaging studies are consistent with end stage degenerative joint disease of the right hip(s) and total hip arthroplasty is deemed medically necessary. The treatment options including medical management, injection therapy, arthroscopy and arthroplasty were discussed at length. The risks and benefits of total hip arthroplasty were presented and reviewed. The risks due to aseptic loosening, infection, stiffness, dislocation/subluxation,  thromboembolic complications and other imponderables were discussed.  The patient acknowledged the explanation, agreed to proceed with the plan and consent was signed. Patient is being admitted for inpatient treatment for surgery, pain control, PT, OT, prophylactic antibiotics, VTE prophylaxis, progressive ambulation and ADL's and discharge planning.The patient is planning to be discharged home with home health services

## 2023-03-07 ENCOUNTER — Observation Stay (HOSPITAL_COMMUNITY)
Admission: RE | Admit: 2023-03-07 | Discharge: 2023-03-08 | Disposition: A | Discharge status: Home / Self Care | Payer: Medicare (Managed Care) | Source: Ambulatory Visit | Attending: Orthopaedic Surgery | Admitting: Orthopaedic Surgery

## 2023-03-07 ENCOUNTER — Encounter (HOSPITAL_COMMUNITY)
Admission: RE | Disposition: A | Discharge status: Home / Self Care | Payer: Self-pay | Source: Ambulatory Visit | Attending: Orthopaedic Surgery

## 2023-03-07 ENCOUNTER — Observation Stay (HOSPITAL_COMMUNITY): Payer: Medicare (Managed Care)

## 2023-03-07 ENCOUNTER — Other Ambulatory Visit: Payer: Self-pay

## 2023-03-07 ENCOUNTER — Ambulatory Visit (HOSPITAL_COMMUNITY): Payer: Medicare (Managed Care)

## 2023-03-07 ENCOUNTER — Ambulatory Visit (HOSPITAL_COMMUNITY): Payer: Medicare (Managed Care) | Admitting: Physician Assistant

## 2023-03-07 ENCOUNTER — Ambulatory Visit (HOSPITAL_COMMUNITY): Payer: Medicare (Managed Care) | Admitting: Anesthesiology

## 2023-03-07 ENCOUNTER — Encounter (HOSPITAL_COMMUNITY): Payer: Self-pay | Admitting: Orthopaedic Surgery

## 2023-03-07 DIAGNOSIS — Z79899 Other long term (current) drug therapy: Secondary | ICD-10-CM | POA: Insufficient documentation

## 2023-03-07 DIAGNOSIS — R7303 Prediabetes: Secondary | ICD-10-CM

## 2023-03-07 DIAGNOSIS — M1611 Unilateral primary osteoarthritis, right hip: Principal | ICD-10-CM

## 2023-03-07 DIAGNOSIS — Z85828 Personal history of other malignant neoplasm of skin: Secondary | ICD-10-CM | POA: Insufficient documentation

## 2023-03-07 DIAGNOSIS — Z96641 Presence of right artificial hip joint: Secondary | ICD-10-CM

## 2023-03-07 DIAGNOSIS — I10 Essential (primary) hypertension: Secondary | ICD-10-CM | POA: Diagnosis not present

## 2023-03-07 DIAGNOSIS — Z01818 Encounter for other preprocedural examination: Secondary | ICD-10-CM

## 2023-03-07 DIAGNOSIS — Z471 Aftercare following joint replacement surgery: Secondary | ICD-10-CM | POA: Diagnosis not present

## 2023-03-07 HISTORY — PX: TOTAL HIP ARTHROPLASTY: SHX124

## 2023-03-07 LAB — TYPE AND SCREEN
ABO/RH(D): O POS
Antibody Screen: NEGATIVE

## 2023-03-07 LAB — ABO/RH: ABO/RH(D): O POS

## 2023-03-07 SURGERY — ARTHROPLASTY, HIP, TOTAL, ANTERIOR APPROACH
Anesthesia: Spinal | Site: Hip | Laterality: Right

## 2023-03-07 MED ORDER — AMISULPRIDE (ANTIEMETIC) 5 MG/2ML IV SOLN: 10.0000 mg | Freq: Once | INTRAVENOUS | Status: DC | PRN

## 2023-03-07 MED ORDER — ACETAMINOPHEN 500 MG PO TABS
1000.0000 mg | ORAL_TABLET | Freq: Once | ORAL | Status: AC
  Administered 2023-03-07: 1000 mg via ORAL
  Filled 2023-03-07: qty 2

## 2023-03-07 MED ORDER — LORATADINE 10 MG PO TABS
10.0000 mg | ORAL_TABLET | Freq: Every day | ORAL | Status: DC
  Administered 2023-03-07 – 2023-03-08 (×2): 10 mg via ORAL
  Filled 2023-03-07 (×2): qty 1

## 2023-03-07 MED ORDER — ROSUVASTATIN CALCIUM 20 MG PO TABS
20.0000 mg | ORAL_TABLET | Freq: Every day | ORAL | Status: DC
  Administered 2023-03-07 – 2023-03-08 (×2): 20 mg via ORAL
  Filled 2023-03-07 (×2): qty 1

## 2023-03-07 MED ORDER — TRANEXAMIC ACID-NACL 1000-0.7 MG/100ML-% IV SOLN
INTRAVENOUS | Status: AC
  Filled 2023-03-07: qty 100

## 2023-03-07 MED ORDER — POLYETHYLENE GLYCOL 3350 17 G PO PACK: 17.0000 g | PACK | Freq: Every day | ORAL | Status: DC | PRN

## 2023-03-07 MED ORDER — CEFAZOLIN SODIUM-DEXTROSE 2-4 GM/100ML-% IV SOLN
2.0000 g | INTRAVENOUS | Status: AC
  Administered 2023-03-07: 2 g via INTRAVENOUS
  Filled 2023-03-07: qty 100

## 2023-03-07 MED ORDER — HYDROMORPHONE HCL 1 MG/ML IJ SOLN: 0.5000 mg | INTRAMUSCULAR | Status: DC | PRN

## 2023-03-07 MED ORDER — MIDAZOLAM HCL 2 MG/2ML IJ SOLN
INTRAMUSCULAR | Status: AC
  Filled 2023-03-07: qty 2

## 2023-03-07 MED ORDER — DOCUSATE SODIUM 100 MG PO CAPS
100.0000 mg | ORAL_CAPSULE | Freq: Two times a day (BID) | ORAL | Status: DC
  Administered 2023-03-07 – 2023-03-08 (×3): 100 mg via ORAL
  Filled 2023-03-07 (×3): qty 1

## 2023-03-07 MED ORDER — DIPHENHYDRAMINE HCL 12.5 MG/5ML PO ELIX: 12.5000 mg | ORAL_SOLUTION | ORAL | Status: DC | PRN

## 2023-03-07 MED ORDER — ALUM & MAG HYDROXIDE-SIMETH 200-200-20 MG/5ML PO SUSP: 30.0000 mL | ORAL | Status: DC | PRN

## 2023-03-07 MED ORDER — METHOCARBAMOL 500 MG PO TABS
500.0000 mg | ORAL_TABLET | Freq: Four times a day (QID) | ORAL | Status: DC | PRN
  Administered 2023-03-07 (×2): 500 mg via ORAL
  Filled 2023-03-07 (×2): qty 1

## 2023-03-07 MED ORDER — POVIDONE-IODINE 10 % EX SWAB
2.0000 | Freq: Once | CUTANEOUS | Status: AC
  Administered 2023-03-07: 2 via TOPICAL

## 2023-03-07 MED ORDER — CHLORHEXIDINE GLUCONATE 0.12 % MT SOLN
15.0000 mL | Freq: Once | OROMUCOSAL | Status: AC
  Administered 2023-03-07: 15 mL via OROMUCOSAL

## 2023-03-07 MED ORDER — MENTHOL 3 MG MT LOZG: 1.0000 | LOZENGE | OROMUCOSAL | Status: DC | PRN

## 2023-03-07 MED ORDER — FENTANYL CITRATE (PF) 100 MCG/2ML IJ SOLN
INTRAMUSCULAR | Status: DC | PRN
  Administered 2023-03-07: 50 ug via INTRAVENOUS

## 2023-03-07 MED ORDER — PROPOFOL 500 MG/50ML IV EMUL
INTRAVENOUS | Status: DC | PRN
  Administered 2023-03-07: 75 ug/kg/min via INTRAVENOUS

## 2023-03-07 MED ORDER — HYDROMORPHONE HCL 1 MG/ML IJ SOLN: 0.2500 mg | INTRAMUSCULAR | Status: DC | PRN

## 2023-03-07 MED ORDER — 0.9 % SODIUM CHLORIDE (POUR BTL) OPTIME
TOPICAL | Status: DC | PRN
  Administered 2023-03-07: 1000 mL

## 2023-03-07 MED ORDER — DEXAMETHASONE SODIUM PHOSPHATE 10 MG/ML IJ SOLN
INTRAMUSCULAR | Status: AC
  Filled 2023-03-07: qty 1

## 2023-03-07 MED ORDER — SODIUM CHLORIDE 0.9 % IV SOLN: INTRAVENOUS | Status: DC

## 2023-03-07 MED ORDER — PROPOFOL 1000 MG/100ML IV EMUL
INTRAVENOUS | Status: AC
  Filled 2023-03-07: qty 100

## 2023-03-07 MED ORDER — SODIUM CHLORIDE 0.9 % IR SOLN
Status: DC | PRN
  Administered 2023-03-07: 1000 mL

## 2023-03-07 MED ORDER — ONDANSETRON HCL 4 MG/2ML IJ SOLN: 4.0000 mg | Freq: Four times a day (QID) | INTRAMUSCULAR | Status: DC | PRN

## 2023-03-07 MED ORDER — ORAL CARE MOUTH RINSE: 15.0000 mL | Freq: Once | OROMUCOSAL | Status: AC

## 2023-03-07 MED ORDER — METHOCARBAMOL 500 MG IVPB - SIMPLE MED: 500.0000 mg | Freq: Four times a day (QID) | INTRAVENOUS | Status: DC | PRN

## 2023-03-07 MED ORDER — PHENYLEPHRINE HCL-NACL 20-0.9 MG/250ML-% IV SOLN
INTRAVENOUS | Status: DC | PRN
  Administered 2023-03-07: 40 ug/min via INTRAVENOUS

## 2023-03-07 MED ORDER — ONDANSETRON HCL 4 MG/2ML IJ SOLN
INTRAMUSCULAR | Status: AC
  Filled 2023-03-07: qty 2

## 2023-03-07 MED ORDER — FENTANYL CITRATE (PF) 100 MCG/2ML IJ SOLN
INTRAMUSCULAR | Status: AC
  Filled 2023-03-07: qty 2

## 2023-03-07 MED ORDER — METOCLOPRAMIDE HCL 5 MG PO TABS: 5.0000 mg | ORAL_TABLET | Freq: Three times a day (TID) | ORAL | Status: DC | PRN

## 2023-03-07 MED ORDER — STERILE WATER FOR IRRIGATION IR SOLN
Status: DC | PRN
  Administered 2023-03-07: 2000 mL

## 2023-03-07 MED ORDER — PHENOL 1.4 % MT LIQD: 1.0000 | OROMUCOSAL | Status: DC | PRN

## 2023-03-07 MED ORDER — ACETAMINOPHEN 325 MG PO TABS: 325.0000 mg | ORAL_TABLET | Freq: Four times a day (QID) | ORAL | Status: DC | PRN

## 2023-03-07 MED ORDER — PANTOPRAZOLE SODIUM 40 MG PO TBEC
40.0000 mg | DELAYED_RELEASE_TABLET | Freq: Every day | ORAL | Status: DC
  Administered 2023-03-07 – 2023-03-08 (×2): 40 mg via ORAL
  Filled 2023-03-07 (×2): qty 1

## 2023-03-07 MED ORDER — CEFAZOLIN SODIUM-DEXTROSE 1-4 GM/50ML-% IV SOLN
1.0000 g | Freq: Four times a day (QID) | INTRAVENOUS | Status: AC
  Administered 2023-03-07 (×2): 1 g via INTRAVENOUS
  Filled 2023-03-07 (×2): qty 50

## 2023-03-07 MED ORDER — MIDAZOLAM HCL 2 MG/2ML IJ SOLN
INTRAMUSCULAR | Status: DC | PRN
  Administered 2023-03-07: 1 mg via INTRAVENOUS

## 2023-03-07 MED ORDER — ASPIRIN 81 MG PO CHEW
81.0000 mg | CHEWABLE_TABLET | Freq: Two times a day (BID) | ORAL | Status: DC
  Administered 2023-03-07 – 2023-03-08 (×2): 81 mg via ORAL
  Filled 2023-03-07 (×2): qty 1

## 2023-03-07 MED ORDER — OXYCODONE HCL 5 MG PO TABS
10.0000 mg | ORAL_TABLET | ORAL | Status: DC | PRN
  Administered 2023-03-07 – 2023-03-08 (×2): 15 mg via ORAL
  Filled 2023-03-07 (×2): qty 3

## 2023-03-07 MED ORDER — LACTATED RINGERS IV SOLN
INTRAVENOUS | Status: DC
  Administered 2023-03-07: 1000 mL via INTRAVENOUS

## 2023-03-07 MED ORDER — ONDANSETRON HCL 4 MG PO TABS: 4.0000 mg | ORAL_TABLET | Freq: Four times a day (QID) | ORAL | Status: DC | PRN

## 2023-03-07 MED ORDER — OXYCODONE HCL 5 MG PO TABS
5.0000 mg | ORAL_TABLET | ORAL | Status: DC | PRN
  Administered 2023-03-07 – 2023-03-08 (×2): 10 mg via ORAL
  Filled 2023-03-07 (×2): qty 2

## 2023-03-07 MED ORDER — PROPOFOL 10 MG/ML IV BOLUS
INTRAVENOUS | Status: DC | PRN
  Administered 2023-03-07: 20 mg via INTRAVENOUS

## 2023-03-07 MED ORDER — METOCLOPRAMIDE HCL 5 MG/ML IJ SOLN: 5.0000 mg | Freq: Three times a day (TID) | INTRAMUSCULAR | Status: DC | PRN

## 2023-03-07 MED ORDER — BUPIVACAINE IN DEXTROSE 0.75-8.25 % IT SOLN
INTRATHECAL | Status: DC | PRN
  Administered 2023-03-07: 1.6 mL via INTRATHECAL

## 2023-03-07 SURGICAL SUPPLY — 39 items
BAG COUNTER SPONGE SURGICOUNT (BAG) ×1 IMPLANT
BAG SPNG CNTER NS LX DISP (BAG) ×1
BLADE SAW SGTL 18X1.27X75 (BLADE) ×1 IMPLANT
COVER PERINEAL POST (MISCELLANEOUS) ×1 IMPLANT
COVER SURGICAL LIGHT HANDLE (MISCELLANEOUS) ×1 IMPLANT
CUP ACET PNNCL SECTR W/GRIP 56 (Hips) IMPLANT
DRAPE FOOT SWITCH (DRAPES) ×1 IMPLANT
DRAPE STERI IOBAN 125X83 (DRAPES) ×1 IMPLANT
DRAPE U-SHAPE 47X51 STRL (DRAPES) ×2 IMPLANT
DRSG AQUACEL AG ADV 3.5X10 (GAUZE/BANDAGES/DRESSINGS) ×1 IMPLANT
DURAPREP 26ML APPLICATOR (WOUND CARE) ×1 IMPLANT
ELECT REM PT RETURN 15FT ADLT (MISCELLANEOUS) ×1 IMPLANT
FEM STEM 12/14 TAPER SZ 4 HIP (Orthopedic Implant) ×1 IMPLANT
FEMORAL STEM 12/14 TPR SZ4 HIP (Orthopedic Implant) IMPLANT
GAUZE XEROFORM 1X8 LF (GAUZE/BANDAGES/DRESSINGS) ×1 IMPLANT
GLOVE BIO SURGEON STRL SZ7.5 (GLOVE) ×1 IMPLANT
GLOVE BIOGEL PI IND STRL 8 (GLOVE) ×2 IMPLANT
GLOVE ECLIPSE 8.0 STRL XLNG CF (GLOVE) ×1 IMPLANT
GOWN STRL REUS W/ TWL XL LVL3 (GOWN DISPOSABLE) ×2 IMPLANT
GOWN STRL REUS W/TWL XL LVL3 (GOWN DISPOSABLE) ×2
HANDPIECE INTERPULSE COAX TIP (DISPOSABLE) ×1
HEAD CERAMIC 36 PLUS5 (Hips) IMPLANT
HOLDER FOLEY CATH W/STRAP (MISCELLANEOUS) ×1 IMPLANT
KIT TURNOVER KIT A (KITS) IMPLANT
PACK ANTERIOR HIP CUSTOM (KITS) ×1 IMPLANT
PINN SECTOR W/GRIP ACE CUP 56 (Hips) ×1 IMPLANT
PINNACLE ALTRX PLUS 4 N 36X56 (Hips) IMPLANT
SET HNDPC FAN SPRY TIP SCT (DISPOSABLE) ×1 IMPLANT
STAPLER VISISTAT 35W (STAPLE) IMPLANT
STRIP CLOSURE SKIN 1/2X4 (GAUZE/BANDAGES/DRESSINGS) IMPLANT
SUT ETHIBOND NAB CT1 #1 30IN (SUTURE) ×1 IMPLANT
SUT ETHILON 2 0 PS N (SUTURE) IMPLANT
SUT MNCRL AB 4-0 PS2 18 (SUTURE) IMPLANT
SUT VIC AB 0 CT1 36 (SUTURE) ×1 IMPLANT
SUT VIC AB 1 CT1 36 (SUTURE) ×1 IMPLANT
SUT VIC AB 2-0 CT1 27 (SUTURE) ×2
SUT VIC AB 2-0 CT1 TAPERPNT 27 (SUTURE) ×2 IMPLANT
TRAY FOLEY MTR SLVR 16FR STAT (SET/KITS/TRAYS/PACK) IMPLANT
YANKAUER SUCT BULB TIP NO VENT (SUCTIONS) ×1 IMPLANT

## 2023-03-07 NOTE — Interval H&P Note (Signed)
History and Physical Interval Note: The patient understands that he is here today for right hip replacement to treat his severe right hip arthritis.  There has been no acute or interval change in medical status.  See H&P.  The risks and benefits of surgery been explained in detail and informed consent is obtained.  The right operative hip has been marked.  03/07/2023 8:43 AM  Thomas Rosario  has presented today for surgery, with the diagnosis of OSTEOARTHRITIS/ DEGENERATIVE JOINT DISEASE RIGHT HIP.  The various methods of treatment have been discussed with the patient and family. After consideration of risks, benefits and other options for treatment, the patient has consented to  Procedure(s): RIGHT TOTAL HIP ARTHROPLASTY ANTERIOR APPROACH (Right) as a surgical intervention.  The patient's history has been reviewed, patient examined, no change in status, stable for surgery.  I have reviewed the patient's chart and labs.  Questions were answered to the patient's satisfaction.     Kathryne Hitch

## 2023-03-07 NOTE — Transfer of Care (Signed)
Immediate Anesthesia Transfer of Care Note  Patient: Crisoforo Oxford  Procedure(s) Performed: RIGHT TOTAL HIP ARTHROPLASTY ANTERIOR APPROACH (Right: Hip)  Patient Location: PACU  Anesthesia Type:Spinal  Level of Consciousness: awake  Airway & Oxygen Therapy: Patient Spontanous Breathing and Patient connected to face mask oxygen  Post-op Assessment: Report given to RN and Post -op Vital signs reviewed and stable  Post vital signs: Reviewed and stable  Last Vitals:  Vitals Value Taken Time  BP 78/54 03/07/23 1139  Temp    Pulse 64 03/07/23 1140  Resp 12 03/07/23 1140  SpO2 99 % 03/07/23 1140  Vitals shown include unvalidated device data.  Last Pain:  Vitals:   03/07/23 0812  TempSrc:   PainSc: 4          Complications: No notable events documented.

## 2023-03-07 NOTE — Evaluation (Signed)
Physical Therapy Evaluation Patient Details Name: Thomas Rosario MRN: [id number] DOB: [birth year] Today's Date: 03/07/2023  History of Present Illness  67 yo male presents to therapy s/p R THA, anterior approach secondary to failure of conservative measures. Pt has PMH including but not limited to: HTN, HDL, and ascending aorta dilation.  Clinical Impression    Thomas Rosario is a 67 y.o. male POD 0 s/p R THA, AA. Patient reports IND with mobility at baseline. Patient is now limited by functional impairments (see PT problem list below) and requires min guard for bed mobility and min guard and cues for transfers. Patient was able to ambulate 80 feet with RW and min guard level of assist with the exception of pt electing to take a step without use of B UE support at RW to offload R LE in stance phase with instability of RLE requiring mod A for balance.  Patient instructed in exercise to facilitate ROM and circulation to manage edema. Pt left seated in recliner, all needs met, wife present and nurse aware of pt increased pain report and request for medication. Patient will benefit from continued skilled PT interventions to address impairments and progress towards PLOF. Acute PT will follow to progress mobility and stair training in preparation for safe discharge home.      Recommendations for follow up therapy are one component of a multi-disciplinary discharge planning process, led by the attending physician.  Recommendations may be updated based on patient status, additional functional criteria and insurance authorization.  Follow Up Recommendations       Assistance Recommended at Discharge Intermittent Supervision/Assistance  Patient can return home with the following  A little help with walking and/or transfers;A little help with bathing/dressing/bathroom;Assistance with cooking/housework;Assist for transportation;Help with stairs or ramp for entrance    Equipment Recommendations Rolling walker (2  wheels)  Recommendations for Other Services       Functional Status Assessment Patient has had a recent decline in their functional status and demonstrates the ability to make significant improvements in function in a reasonable and predictable amount of time.     Precautions / Restrictions Precautions Precautions: Fall Restrictions Weight Bearing Restrictions: No      Mobility  Bed Mobility Overal bed mobility: Needs Assistance Bed Mobility: Supine to Sit     Supine to sit: Min guard, HOB elevated          Transfers Overall transfer level: Needs assistance Equipment used: Rolling walker (2 wheels) Transfers: Sit to/from Stand Sit to Stand: Min guard           General transfer comment: cues for proepr UE and AD placement    Ambulation/Gait Ambulation/Gait assistance: Min guard Gait Distance (Feet): 80 Feet Assistive device: Rolling walker (2 wheels) Gait Pattern/deviations: Step-to pattern, Trunk flexed, Decreased stance time - right (B UE support at RW) Gait velocity: decreased     General Gait Details: pt made attempts to amb without B UE support at RW to offload R LE and R LE instabiltiy noted requiring PT to provide mod A for stabiltiy. pt ed provided on safety and stability, proper use of AD and rational for B UE support. pt indicated he was not going to try that again.  Stairs            Wheelchair Mobility    Modified Rankin (Stroke Patients Only)       Balance Overall balance assessment: Needs assistance Sitting-balance support: Feet supported Sitting balance-Leahy Scale: Good  Standing balance support: Reliant on assistive device for balance, During functional activity (static stand no UE support) Standing balance-Leahy Scale: Fair                               Pertinent Vitals/Pain Pain Assessment Pain Assessment: 0-10 Pain Score: 7  (5/10 at rest and increased to 7/10 with mobility) Pain Location: R hip Pain  Descriptors / Indicators: Aching, Constant, Operative site guarding Pain Intervention(s): Limited activity within patient's tolerance, Monitored during session, Repositioned, Ice applied, Patient requesting pain meds-RN notified (pt informed nurse prior to PT intervention pain 0)    Home Living Family/patient expects to be discharged to:: Private residence Living Arrangements: Spouse/significant other Available Help at Discharge: Family Type of Home: Skilled Nursing Facility Home Access: Stairs to enter Entrance Stairs-Rails: None Entrance Stairs-Number of Steps: 2 Alternate Level Stairs-Number of Steps: 13 Home Layout: Two level Home Equipment: Cane - single point;Crutches;Grab bars - tub/shower (elevated commode seat)      Prior Function Prior Level of Function : Independent/Modified Independent             Mobility Comments: IND with all ADLs, self care tasks, IADLs, driving and working       Hand Dominance        Extremity/Trunk Assessment        Lower Extremity Assessment Lower Extremity Assessment: RLE deficits/detail RLE Deficits / Details: ankle DF/PF 5/5 RLE Sensation: WNL    Cervical / Trunk Assessment Cervical / Trunk Assessment: Normal  Communication   Communication: No difficulties  Cognition Arousal/Alertness: Awake/alert Behavior During Therapy: WFL for tasks assessed/performed Overall Cognitive Status: Within Functional Limits for tasks assessed                                          General Comments      Exercises Total Joint Exercises Ankle Circles/Pumps: AROM, Both, 20 reps   Assessment/Plan    PT Assessment Patient needs continued PT services  PT Problem List Decreased strength;Decreased range of motion;Decreased activity tolerance;Decreased balance;Decreased mobility;Decreased coordination;Decreased knowledge of use of DME;Pain       PT Treatment Interventions DME instruction;Gait training;Stair  training;Functional mobility training;Therapeutic activities;Therapeutic exercise;Balance training;Neuromuscular re-education;Patient/family education;Modalities    PT Goals (Current goals can be found in the Care Plan section)  Acute Rehab PT Goals Patient Stated Goal: to be able to get back to golfing, hiking and riding bikes PT Goal Formulation: With patient Time For Goal Achievement: 03/21/23 Potential to Achieve Goals: Good    Frequency 7X/week     Co-evaluation               AM-PAC PT "6 Clicks" Mobility  Outcome Measure Help needed turning from your back to your side while in a flat bed without using bedrails?: A Little Help needed moving from lying on your back to sitting on the side of a flat bed without using bedrails?: A Little Help needed moving to and from a bed to a chair (including a wheelchair)?: A Little Help needed standing up from a chair using your arms (e.g., wheelchair or bedside chair)?: A Little Help needed to walk in hospital room?: A Little Help needed climbing 3-5 steps with a railing? : A Lot 6 Click Score: 17    End of Session Equipment Utilized During Treatment: Gait belt Activity Tolerance:  Patient tolerated treatment well Patient left: in chair;with call bell/phone within reach;with family/visitor present Nurse Communication: Mobility status;Patient requests pain meds PT Visit Diagnosis: Unsteadiness on feet (R26.81);Other abnormalities of gait and mobility (R26.89);Muscle weakness (generalized) (M62.81);Difficulty in walking, not elsewhere classified (R26.2);Pain Pain - Right/Left: Right Pain - part of body: Hip    Time: 1610-9604 PT Time Calculation (min) (ACUTE ONLY): 37 min   Charges:   PT Evaluation $PT Eval Low Complexity: 1 Low PT Treatments $Gait Training: 8-22 mins        Rica Mote, PT   Jacqualyn Posey 03/07/2023, 6:14 PM

## 2023-03-07 NOTE — Anesthesia Procedure Notes (Signed)
Spinal  Patient location during procedure: OR Start time: 03/07/2023 10:11 AM End time: 03/07/2023 10:12 AM Reason for block: surgical anesthesia Staffing Performed: resident/CRNA  Resident/CRNA: [place], Terence Bart C, CRNA Performed by: [place], Clydene Pugh, CRNA Authorized by: Kaylyn Layer, MD   Preanesthetic Checklist Completed: patient identified, IV checked, site marked, risks and benefits discussed, surgical consent, monitors and equipment checked, pre-op evaluation and timeout performed Spinal Block Patient position: sitting Prep: DuraPrep and site prepped and draped Patient monitoring: heart rate, cardiac monitor, continuous pulse ox and blood pressure Approach: midline Location: L3-4 Injection technique: single-shot Needle Needle type: Pencan  Needle gauge: 24 G Needle length: 9 cm Assessment Sensory level: T4 Events: CSF return Additional Notes IV functioning, monitors applied to pt. Expiration date of kit checked and confirmed to be in date. Sterile prep and drape, hand hygiene and sterile gloved used. Pt was positioned and spine was prepped in sterile fashion. Skin was anesthetized with lidocaine. Free flow of clear CSF obtained prior to injecting local anesthetic into CSF x 1 attempt. Spinal needle aspirated freely following injection. Needle was carefully withdrawn, and pt tolerated procedure well. Loss of motor and sensory on exam post injection.

## 2023-03-07 NOTE — Anesthesia Postprocedure Evaluation (Signed)
Anesthesia Post Note  Patient: Crisoforo Oxford  Procedure(s) Performed: RIGHT TOTAL HIP ARTHROPLASTY ANTERIOR APPROACH (Right: Hip)     Patient location during evaluation: PACU Anesthesia Type: Spinal Level of consciousness: awake and alert Pain management: pain level controlled Vital Signs Assessment: post-procedure vital signs reviewed and stable Respiratory status: spontaneous breathing, nonlabored ventilation and respiratory function stable Cardiovascular status: blood pressure returned to baseline Postop Assessment: no apparent nausea or vomiting, spinal receding, no headache and no backache Anesthetic complications: no   No notable events documented.  Last Vitals:  Vitals:   03/07/23 1245 03/07/23 1345  BP: 103/72 114/73  Pulse: 66 67  Resp: 12 11  Temp: (!) 36.4 C   SpO2: 100% 100%    Last Pain:  Vitals:   03/07/23 1345  TempSrc:   PainSc: 0-No pain                 Shanda Howells

## 2023-03-07 NOTE — Op Note (Signed)
Operative Note  Date of operation: 03/07/2023 Preoperative diagnosis: Right hip primary osteoarthritis Postoperative diagnosis: Same  Procedure: Right direct anterior total hip arthroplasty  Implants: Implant Name Type Inv. Item Serial No. Manufacturer Lot No. LRB No. Used Action  PINN SECTOR W/GRIP ACE CUP 56 - [id number] Hips PINN SECTOR W/GRIP ACE CUP 56  [employer] [id number] Right 1 Implanted  PINNACLE ALTRX PLUS 4 N 36X56 - [id number] Hips PINNACLE ALTRX PLUS 4 N 36X56  [employer] [id number] Right 1 Implanted  FEM STEM 12/14 TAPER SZ 4 HIP - [id number] Orthopedic Implant FEM STEM 12/14 TAPER SZ 4 HIP  DEPUY ORTH[employer] [id number] Right 1 Implanted  HEAD CERAMIC 36 PLUS5 - [id number] Hips HEAD CERAMIC 36 PLUS5  [employer]PU[employer]TH[employer] [id number] Right 1 Implanted   Surgeon: Vanita Panda. Magnus Ivan, MD Assistant: Rexene Edison, PA-C  Anesthesia: Spinal EBL: 200 cc Antibiotics: 2 g IV Ancef Complications: None  Indications: The patient is a 67 year old gentleman well-known to [place].  He has significant arthritis involving his right hip that is causing daily pain and detrimental effect on his mobility, his quality of life and his actives day living.  He has tried and failed all forms conservative treatment.  At this point we recommended a total hip arthroplasty and he agrees with this as well.  We spoke about the risk of acute blood loss anemia, nerve vessel injury, fracture, infection, DVT, implant failure, leg length differences, dislocation, and wound healing issues.  We talked about her goals being hopefully decrease pain, improve mobility, and improve quality of life.  Procedure description: After informed consent was obtained and the appropriate right hip was marked, the patient was brought to the operating room and set up on the stretcher where spinal anesthesia was obtained.  He was laid in supine position on stretcher and is able to assess his leg length.  A Foley catheter was placed  and traction boots were placed on both his feet.  Next he was placed supine on the Hana fracture table with a perineal post in place in both legs and inline skeletal traction devices no traction applied.  Right operative hip was assessed radiographically and then prepped and draped with DuraPrep and sterile drapes.  A timeout was called and he was identified as a correct patient correct right hip.  Incision was then made just inferior and posterior to the ASIS and carried slightly obliquely down the leg.  Dissection was carried down the tensor fascia lata muscle and the tensor fascia was then divided longitudinally to proceed with direct interposed the hip.  Circumflex vessels identified and cauterized and the hip capsule identified and opened up in L-type format finding a moderate joint effusion.  Cobra retractors were placed around the medial and lateral femoral neck and a femoral neck cut was made with an oscillating saw just proximal to the lesser trochanter and this Was completed with an osteotome.  A corkscrew guide was placed in the femoral head and the femoral head was removed in its entirety and there was a wide area devoid of cartilage.  A bent Hohmann was then placed over the medial acetabular rim and remnants of the acetabular labrum and other debris were removed.  Reaming was then initiated from a size 43 reamer and stepwise increments going up to a size 55 reamer with all reamers placed under direct visualization and the last reamer placed under direct fluoroscopy in order to obtain the depth of reaming, the inclination and the anteversion.  The real DePuy sector  GRIPTION acetabular component size 56 was then placed without difficulty under direct visualization and fluoroscopy.  We went with a 36+4 polyethylene liner after that.  Attention was then turned to the femur.  With the right leg externally rotated to 120 degrees, extended and adducted, a Mueller retractor was placed by the medial calcar area  and a bent Hohmann was placed behind the greater trochanter.  The lateral joint capsule was released and a box cutting osteotome was used to enter the femoral canal.  Broaching was then initiated from a size 0 broach and stepwise increments going up to a size 4 broach.  Based off his offset we trialed a high offset femoral neck with a 36+1.5 trial hip ball.  This reduced in the acetabulum and based on assessment we felt like we needed just a touch more leg length.  We dislocated the hip and to the trial components.  We then placed the real Actis femoral component from DePuy with high offset size 4 and the real 36+5 ceramic head ball.  Again this was reduced and acetabulum we assessed it mechanically and radiographically and we are pleased with positioning.  The soft tissue was then irrigated with normal saline solution.  The joint capsule was closed with interrupted #1 Ethibond suture followed by #1 Vicryl close the tensor fascia.  0 Vicryl was used to close the deep tissue and 2-0 Vicryl was used to close subcutaneous tissue.  The skin was closed with interrupted staples.  An Aquacel dressing was applied.  The patient was taken recovery room in stable addition.  Rexene Edison, PA-C did assist during the entire case and beginning to end and his assistance was crucial and medically necessary for soft tissue management and retraction, helping guide implant placement and a layered closure of the wound.

## 2023-03-08 ENCOUNTER — Other Ambulatory Visit (HOSPITAL_COMMUNITY): Payer: Self-pay

## 2023-03-08 DIAGNOSIS — M1611 Unilateral primary osteoarthritis, right hip: Secondary | ICD-10-CM | POA: Diagnosis not present

## 2023-03-08 DIAGNOSIS — R531 Weakness: Secondary | ICD-10-CM | POA: Diagnosis not present

## 2023-03-08 LAB — CBC
HCT: 40.6 % (ref 39.0–52.0)
Hemoglobin: 14.1 g/dL (ref 13.0–17.0)
MCH: 31.7 pg (ref 26.0–34.0)
MCHC: 34.7 g/dL (ref 30.0–36.0)
MCV: 91.2 fL (ref 80.0–100.0)
Platelets: 257 10*3/uL (ref 150–400)
RBC: 4.45 MIL/uL (ref 4.22–5.81)
RDW: 12 % (ref 11.5–15.5)
WBC: 14.8 10*3/uL — ABNORMAL HIGH (ref 4.0–10.5)
nRBC: 0 % (ref 0.0–0.2)

## 2023-03-08 LAB — BASIC METABOLIC PANEL WITH GFR
Anion gap: 7 (ref 5–15)
BUN: 11 mg/dL (ref 8–23)
CO2: 27 mmol/L (ref 22–32)
Calcium: 8.8 mg/dL — ABNORMAL LOW (ref 8.9–10.3)
Chloride: 103 mmol/L (ref 98–111)
Creatinine, Ser: 0.87 mg/dL (ref 0.61–1.24)
GFR, Estimated: >60 mL/min
Glucose, Bld: 142 mg/dL — ABNORMAL HIGH (ref 70–99)
Potassium: 3.9 mmol/L (ref 3.5–5.1)
Sodium: 137 mmol/L (ref 135–145)

## 2023-03-08 MED ORDER — METHOCARBAMOL 500 MG PO TABS
500.0000 mg | ORAL_TABLET | Freq: Four times a day (QID) | ORAL | 1 refills | Status: DC | PRN
  Filled 2023-03-08: qty 30, 8d supply, fill #0

## 2023-03-08 MED ORDER — ASPIRIN 81 MG PO CHEW: 81.0000 mg | CHEWABLE_TABLET | Freq: Two times a day (BID) | ORAL | 0 refills | Status: DC

## 2023-03-08 MED ORDER — ASPIRIN 81 MG PO CHEW: 81.0000 mg | CHEWABLE_TABLET | Freq: Every day | ORAL | 0 refills | Status: DC

## 2023-03-08 MED ORDER — METHOCARBAMOL 500 MG PO TABS: 500.0000 mg | ORAL_TABLET | Freq: Four times a day (QID) | ORAL | 1 refills | Status: DC | PRN

## 2023-03-08 MED ORDER — OXYCODONE HCL 5 MG PO TABS
5.0000 mg | ORAL_TABLET | ORAL | 0 refills | Status: DC | PRN
  Filled 2023-03-08: qty 30, 3d supply, fill #0

## 2023-03-08 MED ORDER — OXYCODONE HCL 5 MG PO TABS: 5.0000 mg | ORAL_TABLET | Freq: Four times a day (QID) | ORAL | 0 refills | Status: DC | PRN

## 2023-03-08 NOTE — Progress Notes (Signed)
Physical Therapy Treatment Patient Details Name: Thomas Rosario MRN: [id number] DOB: 08/11/1956 Today's Date: 03/08/2023   History of Present Illness 67 yo male presents to therapy s/p R THA, anterior approach secondary to failure of conservative measures. Pt has PMH including but not limited to: HTN, HDL, and ascending aorta dilation.    PT Comments    Progressing toward goals. Reviewed stairs with pt and wife, see below; reviewed HEP and progression for home. Pt and wife verbalize they feel ready to d/c  home today, waiting on RW and finding a pharmacy with meds available   Recommendations for follow up therapy are one component of a multi-disciplinary discharge planning process, led by the attending physician.  Recommendations may be updated based on patient status, additional functional criteria and insurance authorization.  Follow Up Recommendations       Assistance Recommended at Discharge Intermittent Supervision/Assistance  Patient can return home with the following A little help with walking and/or transfers;A little help with bathing/dressing/bathroom;Assistance with cooking/housework;Assist for transportation;Help with stairs or ramp for entrance   Equipment Recommendations  Rolling walker (2 wheels)    Recommendations for Other Services       Precautions / Restrictions Precautions Precautions: Fall Restrictions Weight Bearing Restrictions: No Other Position/Activity Restrictions: WBAT     Mobility  Bed Mobility               General bed mobility comments: in recliner    Transfers Overall transfer level: Needs assistance Equipment used: Rolling walker (2 wheels) Transfers: Sit to/from Stand Sit to Stand: Supervision           General transfer comment: cues for proper UE and RLE position    Ambulation/Gait Ambulation/Gait assistance: Supervision Gait Distance (Feet): 90 Feet Assistive device: Rolling walker (2 wheels) Gait Pattern/deviations:  Step-to pattern, Trunk flexed, Decreased stance time - right, Step-through pattern       General Gait Details: verbal cues for trunk extension, hip extension; beginning step through gait   Stairs Stairs: Yes Stairs assistance: Min guard Stair Management: No rails, One rail Right, One rail Left, Step to pattern, Forwards, Backwards, With crutches Number of Stairs:  (2 and 3) General stair comments: up/down 2 steps backwards with RW; up and down 3 steps with rail and cane; cues for sequence, technique, min/guard for safety. no LOB   Wheelchair Mobility    Modified Rankin (Stroke Patients Only)       Balance                                            Cognition Arousal/Alertness: Awake/alert Behavior During Therapy: WFL for tasks assessed/performed Overall Cognitive Status: Within Functional Limits for tasks assessed                                          Exercises Total Joint Exercises Ankle Circles/Pumps: AROM, Both, 10 reps Quad Sets: AROM, Both, 10 reps Heel Slides: AAROM, Right, 5 reps Hip ABduction/ADduction: AROM, Right, 10 reps Long Arc Quad: AROM, Right, 5 reps, Seated    General Comments        Pertinent Vitals/Pain Pain Assessment Pain Assessment: 0-10 Pain Score: 4  Pain Location: R hip Pain Descriptors / Indicators: Aching, Constant, Operative site guarding Pain Intervention(s): Limited activity within  patient's tolerance, Monitored during session, Repositioned, Ice applied, Premedicated before session    Home Living                          Prior Function            PT Goals (current goals can now be found in the care plan section) Acute Rehab PT Goals Patient Stated Goal: to be able to get back to golfing, hiking and riding bikes PT Goal Formulation: With patient Time For Goal Achievement: 03/21/23 Potential to Achieve Goals: Good Progress towards PT goals: Progressing toward goals     Frequency    7X/week      PT Plan Current plan remains appropriate    Co-evaluation              AM-PAC PT "6 Clicks" Mobility   Outcome Measure  Help needed turning from your back to your side while in a flat bed without using bedrails?: A Little Help needed moving from lying on your back to sitting on the side of a flat bed without using bedrails?: A Little Help needed moving to and from a bed to a chair (including a wheelchair)?: None Help needed standing up from a chair using your arms (e.g., wheelchair or bedside chair)?: None Help needed to walk in hospital room?: A Little Help needed climbing 3-5 steps with a railing? : A Little 6 Click Score: 20    End of Session Equipment Utilized During Treatment: Gait belt Activity Tolerance: Patient tolerated treatment well Patient left: in chair;with call bell/phone within reach;with family/visitor present Nurse Communication: Other (comment);Mobility status (needs RW) PT Visit Diagnosis: Unsteadiness on feet (R26.81);Other abnormalities of gait and mobility (R26.89);Muscle weakness (generalized) (M62.81);Difficulty in walking, not elsewhere classified (R26.2);Pain Pain - Right/Left: Right Pain - part of body: Hip     Time: 1610-9604 PT Time Calculation (min) (ACUTE ONLY): 32 min  Charges:  $Gait Training: 23-37 mins                     Jonathyn Carothers, PT  Acute Rehab Dept ([facility]) [phone]  03/08/2023    [facility] 03/08/2023, 2:46 PM

## 2023-03-08 NOTE — TOC Transition Note (Signed)
Transition of Care [facility]) - CM/SW Discharge Note   Patient Details  Name: Thomas Rosario MRN: [id number] Date of Birth: 04/17/1956  Transition of Care [facility]) CM/SW Contact:  Amada Jupiter, LCSW Phone Number: 03/08/2023, 11:26 AM   Clinical Narrative:     Met with pt and wife to review recommendation for HHPT follow up - unfortunately, have been unable to secure any HH who can accept with barrier either insurance network or staffing.  Have alerted MD and PT as well and plan for pt to have HEP and directed him to contact MD office on Monday if he would like to set up OPPT.  Confirmed pt does need RW for home and no DME agency preference - referral place with [employer] [facility] for delivery to room today.  Final next level of care: Home/Self Care (vs. possible OPPT) Barriers to Discharge: No Barriers Identified   Patient Goals and CMS Choice      Discharge Placement                         Discharge Plan and Services Additional resources added to the After Visit Summary for                  DME Arranged: Walker rolling DME Agency: [employer] Date DME Agency Contacted: 03/08/23 Time DME Agency Contacted: [phone] Representative spoke with at DME Agency: Leavy Cella            Social Determinants of Health (SDOH) Interventions SDOH Screenings   Food Insecurity: Patient Declined (03/07/2023)  Housing: Low Risk  (03/07/2023)  Transportation Needs: No Transportation Needs (03/07/2023)  Utilities: Patient Declined (03/07/2023)  Depression (PHQ2-9): Low Risk  (10/21/2022)  Tobacco Use: Low Risk  (03/07/2023)     Readmission Risk Interventions     No data to display

## 2023-03-08 NOTE — Progress Notes (Signed)
Subjective: 1 Day Post-Op Procedure(s) (LRB): RIGHT TOTAL HIP ARTHROPLASTY ANTERIOR APPROACH (Right) Patient reports pain as moderate.    Objective: Vital signs in last 24 hours: Temp:  [95 F (35 C)-98.2 F (36.8 C)] 98.2 F (36.8 C) (04/13 0933) Pulse Rate:  [63-102] 102 (04/13 0933) Resp:  [7-19] 18 (04/13 0933) BP: (84-140)/(59-95) 131/86 (04/13 0933) SpO2:  [92 %-100 %] 100 % (04/13 0933)  Intake/Output from previous day: 04/12 0701 - 04/13 0700 In: 2150 [I.V.:1950; IV Piggyback:200] Out: 2550 [Urine:2400; Blood:150] Intake/Output this shift: Total I/O In: -  Out: 650 [Urine:650]  Recent Labs    03/08/23 0300  HGB 14.1   Recent Labs    03/08/23 0300  WBC 14.8*  RBC 4.45  HCT 40.6  PLT 257   Recent Labs    03/08/23 0300  NA 137  K 3.9  CL 103  CO2 27  BUN 11  CREATININE 0.87  GLUCOSE 142*  CALCIUM 8.8*   No results for input(s): "LABPT", "INR" in the last 72 hours.  Sensation intact distally Intact pulses distally Dorsiflexion/Plantar flexion intact Incision: dressing C/D/I   Assessment/Plan: 1 Day Post-Op Procedure(s) (LRB): RIGHT TOTAL HIP ARTHROPLASTY ANTERIOR APPROACH (Right) Up with therapy Discharge home with home health this afternoon if clears therapy.      Kathryne Hitch 03/08/2023, 10:25 AM

## 2023-03-08 NOTE — Progress Notes (Signed)
Patient discharged to home w/ wife. Given all belongings, instructions, equipment. Verbalized understanding of instructions. Escorted to pov via w/c. 

## 2023-03-08 NOTE — Evaluation (Addendum)
Physical Therapy Evaluation Patient Details Name: Thomas Rosario MRN: [id number] DOB: Sep 22, 1956 Today's Date: 03/08/2023  History of Present Illness  67 yo male presents to therapy s/p R THA, anterior approach secondary to failure of conservative measures. Pt has PMH including but not limited to: HTN, HDL, and ascending aorta dilation.  Clinical Impression  Pt progressing well this session. Concerned about ascending stairs at home, wife states they can bring bed downstairs if needed. Will review stairs this pm and pt will likely be abel to d/c later today       Recommendations for follow up therapy are one component of a multi-disciplinary discharge planning process, led by the attending physician.  Recommendations may be updated based on patient status, additional functional criteria and insurance authorization.  Follow Up Recommendations       Assistance Recommended at Discharge Intermittent Supervision/Assistance  Patient can return home with the following  A little help with walking and/or transfers;A little help with bathing/dressing/bathroom;Assistance with cooking/housework;Assist for transportation;Help with stairs or ramp for entrance    Equipment Recommendations RW  Recommendations for Other Services       Functional Status Assessment       Precautions / Restrictions Precautions Precautions: Fall Restrictions Weight Bearing Restrictions: No Other Position/Activity Restrictions: WBAT      Mobility  Bed Mobility               General bed mobility comments: in recliner    Transfers Overall transfer level: Needs assistance Equipment used: Rolling walker (2 wheels) Transfers: Sit to/from Stand Sit to Stand: Supervision           General transfer comment: cues for proper UE and RLE position    Ambulation/Gait Ambulation/Gait assistance: Min guard, Supervision Gait Distance (Feet): 90 Feet Assistive device: Rolling walker (2 wheels) Gait  Pattern/deviations: Step-to pattern, Trunk flexed, Decreased stance time - right       General Gait Details: verbal cues for trunk extension, hip extension  Stairs            Wheelchair Mobility    Modified Rankin (Stroke Patients Only)       Balance                                             Pertinent Vitals/Pain Pain Assessment Pain Assessment: 0-10 Pain Score: 4  Pain Location: R hip Pain Descriptors / Indicators: Aching, Constant, Operative site guarding Pain Intervention(s): Limited activity within patient's tolerance, Monitored during session, Premedicated before session, Repositioned, Ice applied    Home Living                          Prior Function                       Hand Dominance        Extremity/Trunk Assessment                Communication      Cognition Arousal/Alertness: Awake/alert Behavior During Therapy: WFL for tasks assessed/performed Overall Cognitive Status: Within Functional Limits for tasks assessed                                          General  Comments      Exercises Total Joint Exercises Ankle Circles/Pumps: AROM, Both, 10 reps Quad Sets: AROM, Both, 10 reps Heel Slides: AAROM, Right, 5 reps   Assessment/Plan    PT Assessment    PT Problem List         PT Treatment Interventions      PT Goals (Current goals can be found in the Care Plan section)  Acute Rehab PT Goals Patient Stated Goal: to be able to get back to golfing, hiking and riding bikes PT Goal Formulation: With patient Time For Goal Achievement: 03/21/23 Potential to Achieve Goals: Good    Frequency 7X/week     Co-evaluation               AM-PAC PT "6 Clicks" Mobility  Outcome Measure Help needed turning from your back to your side while in a flat bed without using bedrails?: A Little Help needed moving from lying on your back to sitting on the side of a flat bed without  using bedrails?: A Little Help needed moving to and from a bed to a chair (including a wheelchair)?: A Little Help needed standing up from a chair using your arms (e.g., wheelchair or bedside chair)?: A Little Help needed to walk in hospital room?: A Little Help needed climbing 3-5 steps with a railing? : A Lot 6 Click Score: 17    End of Session Equipment Utilized During Treatment: Gait belt Activity Tolerance: Patient tolerated treatment well Patient left: in chair;with call bell/phone within reach;with chair alarm set   PT Visit Diagnosis: Unsteadiness on feet (R26.81);Other abnormalities of gait and mobility (R26.89);Muscle weakness (generalized) (M62.81);Difficulty in walking, not elsewhere classified (R26.2);Pain Pain - Right/Left: Right Pain - part of body: Hip    Time: 1610-9604 PT Time Calculation (min) (ACUTE ONLY): 23 min   Charges:   Gait Training: 8-22 mins        Delice Bison, PT  Acute Rehab Dept [facility]) [phone]  03/08/2023   [facility] 03/08/2023, 10:53 AM

## 2023-03-08 NOTE — Discharge Summary (Signed)
Patient ID: Thomas Rosario MRN: [id number] DOB/AGE: 1956/09/17 67 y.o.  Admit date: 03/07/2023 Discharge date: 03/08/2023  Admission Diagnoses:  Principal Problem:   Unilateral primary osteoarthritis, right hip Active Problems:   Status post total replacement of right hip   Discharge Diagnoses:  Same  Past Medical History:  Diagnosis Date   Arthritis    BCC (basal cell carcinoma of skin) 01/29/2012   HLD (hyperlipidemia) 12/22/2018   HTN (hypertension) 12/22/2018    Surgeries: Procedure(s): RIGHT TOTAL HIP ARTHROPLASTY ANTERIOR APPROACH on 03/07/2023   Consultants:   Discharged Condition: Improved  Hospital Course: Thomas Rosario is an 67 y.o. male who was admitted 03/07/2023 for operative treatment ofUnilateral primary osteoarthritis, right hip. Patient has severe unremitting pain that affects sleep, daily activities, and work/hobbies. After pre-op clearance the patient was taken to the operating room on 03/07/2023 and underwent  Procedure(s): RIGHT TOTAL HIP ARTHROPLASTY ANTERIOR APPROACH.    Patient was given perioperative antibiotics:  Anti-infectives (From admission, onward)    Start     Dose/Rate Route Frequency Ordered Stop   03/07/23 1600  ceFAZolin (ANCEF) IVPB 1 g/50 mL premix        1 g 100 mL/hr over 30 Minutes Intravenous Every 6 hours 03/07/23 1438 03/07/23 2205   03/07/23 0745  ceFAZolin (ANCEF) IVPB 2g/100 mL premix        2 g 200 mL/hr over 30 Minutes Intravenous On call to O.R. 03/07/23 0741 03/07/23 1015        Patient was given sequential compression devices, early ambulation, and chemoprophylaxis to prevent DVT.  Patient benefited maximally from hospital stay and there were no complications.    Recent vital signs: Patient Vitals for the past 24 hrs:  BP Temp Temp src Pulse Resp SpO2  03/08/23 1338 (!) 137/95 97.7 F (36.5 C) Oral 99 13 99 %  03/08/23 0933 131/86 98.2 F (36.8 C) -- (!) 102 18 100 %  03/08/23 0541 126/81 (!) 97.5 F (36.4 C) Oral  85 16 94 %  03/07/23 2115 (!) 140/91 98.2 F (36.8 C) Oral 92 16 96 %  03/07/23 1727 (!) 136/95 98.1 F (36.7 C) Oral (!) 102 18 92 %     Recent laboratory studies:  Recent Labs    03/08/23 0300  WBC 14.8*  HGB 14.1  HCT 40.6  PLT 257  NA 137  K 3.9  CL 103  CO2 27  BUN 11  CREATININE 0.87  GLUCOSE 142*  CALCIUM 8.8*     Discharge Medications:   Allergies as of 03/08/2023   No Known Allergies      Medication List     TAKE these medications    aspirin 81 MG chewable tablet Commonly known as: Aspirin Childrens Chew 1 tablet (81 mg total) by mouth daily.   celecoxib 200 MG capsule Commonly known as: CELEBREX Take 1 capsule (200 mg total) by mouth 2 (two) times daily as needed. What changed: when to take this   loratadine 10 MG tablet Commonly known as: CLARITIN Take 10 mg by mouth daily.   methocarbamol 500 MG tablet Commonly known as: ROBAXIN Take 1 tablet (500 mg total) by mouth every 6 (six) hours as needed for muscle spasms.   naproxen sodium 220 MG tablet Commonly known as: ALEVE Take 220 mg by mouth daily.   oxyCODONE 5 MG immediate release tablet Commonly known as: Roxicodone Take 1-2 tablets (5-10 mg total) by mouth every 4 (four) hours as needed for severe pain.  rosuvastatin 20 MG tablet Commonly known as: Crestor Take 1 tablet (20 mg total) by mouth daily.               Durable Medical Equipment  (From admission, onward)           Start     Ordered   03/07/23 1439  DME Walker rolling  Once       Question Answer Comment  Walker: With 5 Inch Wheels   Patient needs a walker to treat with the following condition Status post total replacement of right hip      03/07/23 1438   03/07/23 1439  DME 3 n 1  Once        03/07/23 1438            Diagnostic Studies: DG Pelvis Portable  Result Date: 03/07/2023 CLINICAL DATA:  [id number] Status post total replacement of right hip [id number] EXAM: PORTABLE PELVIS 1-2 VIEWS  COMPARISON:  None Available. FINDINGS: Postsurgical changes of right hip arthroplasty. Normal alignment. No evidence of loosening or fracture. Expected soft tissue changes. IMPRESSION: Postsurgical changes of right hip arthroplasty. Normal alignment. No evidence of immediate hardware complication. Electronically Signed   By: Caprice Renshaw M.D.   On: 03/07/2023 12:34   DG HIP UNILAT WITH PELVIS 1V RIGHT  Result Date: 03/07/2023 CLINICAL DATA:  Operative imaging for right hip arthroplasty. EXAM: DG HIP (WITH OR WITHOUT PELVIS) 1V RIGHT; DG C-ARM 1-60 MIN-NO REPORT Fluoro time: 24.5 seconds.  Dose: 2.587 mGy. COMPARISON:  Hip MRI, 12/25/2022. FINDINGS: Three submitted images show a well-positioned, well aligned right total hip arthroplasty. No acute fracture or evidence of an operative complication IMPRESSION: Imaging provided for right hip arthroplasty. Electronically Signed   By: Amie Portland M.D.   On: 03/07/2023 11:34   DG C-Arm 1-60 Min-No Report  Result Date: 03/07/2023 Fluoroscopy was utilized by the requesting physician.  No radiographic interpretation.    Disposition: Discharge disposition: 01-Home or Self Care          Follow-up Information     Kathryne Hitch, MD Follow up in 2 week(s).   Specialty: Orthopedic Surgery Contact information: [address] [place] [address] [phone]                  Signed: Kathryne Hitch 03/08/2023, 5:20 PM

## 2023-03-08 NOTE — Discharge Instructions (Signed)

## 2023-03-10 ENCOUNTER — Telehealth: Payer: Self-pay

## 2023-03-10 NOTE — Transitions of Care (Post Inpatient/ED Visit) (Signed)
   03/10/2023  Name: DAEVION STEFFAN MRN: [id number] DOB: 07-28-1956  Today's TOC FU Call Status: Today's TOC FU Call Status:: Successful TOC FU Call Competed TOC FU Call Complete Date: 03/10/23  Transition Care Management Follow-up Telephone Call Date of Discharge: 03/08/23 Discharge Facility: [facility]) Type of Discharge: Inpatient Admission Primary Inpatient Discharge Diagnosis:: hypertension How have you been since you were released from the hospital?: Better Any questions or concerns?: No  Items Reviewed: Did you receive and understand the discharge instructions provided?: Yes Medications obtained and verified?: Yes (Medications Reviewed) Any new allergies since your discharge?: No Dietary orders reviewed?: Yes Do you have support at home?: Yes People in Home: spouse  Home Care and Equipment/Supplies: Were Home Health Services Ordered?: NA Any new equipment or medical supplies ordered?: Yes Name of Medical supply agency?: [facility] Were you able to get the equipment/medical supplies?: Yes Do you have any questions related to the use of the equipment/supplies?: No  Functional Questionnaire: Do you need assistance with bathing/showering or dressing?: Yes Do you need assistance with meal preparation?: No Do you need assistance with eating?: No Do you have difficulty maintaining continence: No Do you need assistance with getting out of bed/getting out of a chair/moving?: Yes Do you have difficulty managing or taking your medications?: No  Follow up appointments reviewed: PCP Follow-up appointment confirmed?: NA Specialist Hospital Follow-up appointment confirmed?: Yes Date of Specialist follow-up appointment?: 03/20/23 Follow-Up Specialty Provider:: Dr Magnus Ivan Do you need transportation to your follow-up appointment?: No Do you understand care options if your condition(s) worsen?: Yes-patient verbalized understanding    SIGNATURE Karena Addison, LPN [facility] Nurse  Health Advisor Direct Dial [phone]

## 2023-03-12 ENCOUNTER — Encounter (HOSPITAL_COMMUNITY): Payer: Self-pay | Admitting: Orthopaedic Surgery

## 2023-03-20 ENCOUNTER — Ambulatory Visit (INDEPENDENT_AMBULATORY_CARE_PROVIDER_SITE_OTHER): Payer: Medicare (Managed Care) | Admitting: Physician Assistant

## 2023-03-20 ENCOUNTER — Encounter: Payer: Medicare (Managed Care) | Admitting: Orthopaedic Surgery

## 2023-03-20 ENCOUNTER — Encounter: Payer: Self-pay | Admitting: Physician Assistant

## 2023-03-20 DIAGNOSIS — Z96641 Presence of right artificial hip joint: Secondary | ICD-10-CM

## 2023-03-20 NOTE — Progress Notes (Signed)
HPI: Mr. Rinehart returns today status post right total hip arthroplasty 03/07/2023.  He is ambulating with a walker.  Feels overall he is doing well.  He describes his pain is mild stiffness in the hip.  Negative for fevers chills.  He is on aspirin 81 mg twice daily for DVT prophylaxis.  He is also taking Robaxin and oxycodone for pain.   Physical exam: General: Well-developed well-nourished male no acute distress mood and affect appropriate Right hip: Surgical incisions healing well no signs of wound dehiscence.  No signs of infection.  No seroma.  Fluid motion of the right hip.  Right calf supple nontender.  Dorsiflexion plantarflexion right ankle intact.  Impression: Status post right total hip arthroplasty  Plan: He will continue 81 mg aspirin once daily for another week and then discontinue.  Scar tissue mobilization encouraged.  Staples were removed Steri-Strips applied.  He will follow-up with [place] in 1 month sooner if there is any questions concerns or any signs of infection.

## 2023-03-28 ENCOUNTER — Encounter: Payer: Self-pay | Admitting: Orthopaedic Surgery

## 2023-04-17 ENCOUNTER — Ambulatory Visit (INDEPENDENT_AMBULATORY_CARE_PROVIDER_SITE_OTHER): Payer: Medicare (Managed Care) | Admitting: Physician Assistant

## 2023-04-17 ENCOUNTER — Encounter: Payer: Self-pay | Admitting: Physician Assistant

## 2023-04-17 DIAGNOSIS — Z96641 Presence of right artificial hip joint: Secondary | ICD-10-CM

## 2023-04-17 NOTE — Progress Notes (Signed)
HPI: [name] returns today 6 weeks status post right total hip arthroplasty.  Overall states he is doing great aches and pains are gone.  He has occasional soreness but no pain in the hip.  He does ice the hip at times he states that he has some swelling about the hip at times.  He has no concerns in regards to his hip at this point in time other than the cannot bleed when doing steps with the right leg.   Physical exam: General well-developed well-nourished male ambulates without any assistive devices nonantalgic gait. Right hip good range of motion of the hip without pain.  Calf supple nontender dorsiflexion plantarflexion ankle intact.  Surgical incisions healing well without any signs of infection.  It attempted to aspirate at this ended and small amount of dark blood consistent with a hematoma.   Impression: Status post right total hip arthroplasty 03/07/2023  Plan: Offered him formal therapy so they could work on stairs with him and leading with the right leg he defers.  He will continue to work on gait balance and overall strengthening.  Follow-up with [place] in 1 month for check to make sure that he is progressing well.  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

## 2023-04-21 NOTE — Patient Instructions (Incomplete)
Good to see you again today I will be in touch with your labs Recommend covid booster if not done in the last 9 months or so Assuming all is well please see me in about 6 months  I am so glad your hip is doing well!

## 2023-04-21 NOTE — Progress Notes (Unsigned)
[facility] [address], [address] [place], [place] [address] [phone]  Date:  04/23/2023   Name:  Thomas Rosario   DOB:  1956/01/08   MRN:  [id number]  PCP:  Pearline Cables, MD    Chief Complaint: No chief complaint on file.   History of Present Illness:  Thomas Rosario is a 67 y.o. very pleasant male patient who presents with the following:  Patient seen today for periodic follow-up- hypertension, hyperlipidemia, skin cancer  Most recent visit with myself was in November  Dermatology follow-up: At our last visit he was taking Celebrex twice a day for chronic hip pain-since then he has not have any hip replacements on April 12  Pneumonia vaccine Recommend COVID-19 booster Colon cancer screening is up-to-date  He had some lab work done in April around the time of his hip replacement-BMP, A1C CBC only Lab Results  Component Value Date   HGBA1C 5.7 (H) 02/26/2023    Aspirin 81 Crestor 20 CT coronary calcium done last year, 50th percentile-this is when I started him on Crestor Patient Active Problem List   Diagnosis Date Noted   Status post total replacement of right hip 03/07/2023   Unilateral primary osteoarthritis, right hip 12/30/2022   Elevated coronary artery calcium score 10/22/2022   Ascending aorta dilatation (HCC) 10/22/2022   Prediabetes 10/21/2022   Postnasal drip 01/05/2019   HTN (hypertension) 12/22/2018   HLD (hyperlipidemia) 12/22/2018   Family history of heart attack 12/22/2018   Allergic rhinitis 02/01/2016   BCC (basal cell carcinoma of skin) 01/29/2012    Past Medical History:  Diagnosis Date   Arthritis    BCC (basal cell carcinoma of skin) 01/29/2012   HLD (hyperlipidemia) 12/22/2018   HTN (hypertension) 12/22/2018    Past Surgical History:  Procedure Laterality Date   TOTAL HIP ARTHROPLASTY Right 03/07/2023   Procedure: RIGHT TOTAL HIP ARTHROPLASTY ANTERIOR APPROACH;  Surgeon:  Kathryne Hitch, MD;  Location: [facility];  Service: Orthopedics;  Laterality: Right;    Social History   Tobacco Use   Smoking status: Never   Smokeless tobacco: Never  Vaping Use   Vaping Use: Never used  Substance Use Topics   Alcohol use: Yes    Alcohol/week: 6.0 standard drinks of alcohol    Types: 6 Glasses of wine per week   Drug use: Never    Family History  Problem Relation Age of Onset   Early death Father    Heart attack Father     No Known Allergies  Medication list has been reviewed and updated.  Current Outpatient Medications on File Prior to Visit  Medication Sig Dispense Refill   aspirin (ASPIRIN CHILDRENS) 81 MG chewable tablet Chew 1 tablet (81 mg total) by mouth daily. 30 tablet 0   celecoxib (CELEBREX) 200 MG capsule Take 1 capsule (200 mg total) by mouth 2 (two) times daily as needed. (Patient taking differently: Take 200 mg by mouth daily.) 60 capsule 1   loratadine (CLARITIN) 10 MG tablet Take 10 mg by mouth daily.     methocarbamol (ROBAXIN) 500 MG tablet Take 1 tablet (500 mg total) by mouth every 6 (six) hours as needed for muscle spasms. 30 tablet 1   naproxen sodium (ALEVE) 220 MG tablet Take 220 mg by mouth daily.     oxyCODONE (ROXICODONE) 5 MG immediate release tablet Take 1-2 tablets (5-10 mg total) by mouth every 4 (four) hours as  needed for severe pain. 30 tablet 0   rosuvastatin (CRESTOR) 20 MG tablet Take 1 tablet (20 mg total) by mouth daily. 90 tablet 3   No current facility-administered medications on file prior to visit.    Review of Systems:  As per HPI- otherwise negative.   Physical Examination: There were no vitals filed for this visit. There were no vitals filed for this visit. There is no height or weight on file to calculate BMI. Ideal Body Weight:   GEN: no acute distress. HEENT: Atraumatic, Normocephalic.  Ears and Nose: No external deformity. CV: RRR, No M/G/R. No JVD. No thrill. No extra heart sounds. PULM:  CTA B, no wheezes, crackles, rhonchi. No retractions. No resp. distress. No accessory muscle use. ABD: S, NT, ND, +BS. No rebound. No HSM. EXTR: No c/c/e PSYCH: Normally interactive. Conversant.    Assessment and Plan: ***  Signed Abbe Amsterdam, MD

## 2023-04-23 ENCOUNTER — Encounter: Payer: Self-pay | Admitting: Family Medicine

## 2023-04-23 ENCOUNTER — Ambulatory Visit (INDEPENDENT_AMBULATORY_CARE_PROVIDER_SITE_OTHER): Payer: Medicare (Managed Care) | Admitting: Family Medicine

## 2023-04-23 VITALS — BP 132/78 | HR 88 | Temp 97.6°F | Resp 18 | Ht 69.0 in | Wt 200.2 lb

## 2023-04-23 DIAGNOSIS — R931 Abnormal findings on diagnostic imaging of heart and coronary circulation: Secondary | ICD-10-CM | POA: Diagnosis not present

## 2023-04-23 DIAGNOSIS — Z125 Encounter for screening for malignant neoplasm of prostate: Secondary | ICD-10-CM | POA: Diagnosis not present

## 2023-04-23 DIAGNOSIS — I10 Essential (primary) hypertension: Secondary | ICD-10-CM

## 2023-04-23 DIAGNOSIS — Z96641 Presence of right artificial hip joint: Secondary | ICD-10-CM

## 2023-04-23 DIAGNOSIS — E782 Mixed hyperlipidemia: Secondary | ICD-10-CM | POA: Diagnosis not present

## 2023-04-23 LAB — COMPREHENSIVE METABOLIC PANEL
ALT: 25 U/L (ref 0–53)
AST: 18 U/L (ref 0–37)
Albumin: 4 g/dL (ref 3.5–5.2)
Alkaline Phosphatase: 77 U/L (ref 39–117)
BUN: 14 mg/dL (ref 6–23)
CO2: 29 mEq/L (ref 19–32)
Calcium: 9.4 mg/dL (ref 8.4–10.5)
Chloride: 106 mEq/L (ref 96–112)
Creatinine, Ser: 0.95 mg/dL (ref 0.40–1.50)
GFR: 83.66 mL/min (ref >60.00)
Glucose, Bld: 95 mg/dL (ref 70–99)
Potassium: 4.2 mEq/L (ref 3.5–5.1)
Sodium: 142 mEq/L (ref 135–145)
Total Bilirubin: 0.8 mg/dL (ref 0.2–1.2)
Total Protein: 6.4 g/dL (ref 6.0–8.3)

## 2023-04-23 LAB — CBC
HCT: 44.7 % (ref 39.0–52.0)
Hemoglobin: 14.9 g/dL (ref 13.0–17.0)
MCHC: 33.3 g/dL (ref 30.0–36.0)
MCV: 91.3 fl (ref 78.0–100.0)
Platelets: 276 10*3/uL (ref 150.0–400.0)
RBC: 4.9 Mil/uL (ref 4.22–5.81)
RDW: 13.3 % (ref 11.5–15.5)
WBC: 5.2 10*3/uL (ref 4.0–10.5)

## 2023-04-23 LAB — LIPID PANEL
Cholesterol: 158 mg/dL (ref 0–200)
HDL: 46 mg/dL (ref >39.00)
LDL Cholesterol: 92 mg/dL (ref 0–99)
NonHDL: 111.56
Total CHOL/HDL Ratio: 3
Triglycerides: 97 mg/dL (ref 0.0–149.0)
VLDL: 19.4 mg/dL (ref 0.0–40.0)

## 2023-04-23 LAB — PSA: PSA: 0.27 ng/mL (ref 0.10–4.00)

## 2023-04-27 ENCOUNTER — Encounter: Payer: Self-pay | Admitting: Orthopaedic Surgery

## 2023-05-19 ENCOUNTER — Ambulatory Visit (INDEPENDENT_AMBULATORY_CARE_PROVIDER_SITE_OTHER): Payer: Medicare (Managed Care) | Admitting: Orthopaedic Surgery

## 2023-05-19 ENCOUNTER — Encounter: Payer: Self-pay | Admitting: Orthopaedic Surgery

## 2023-05-19 DIAGNOSIS — Z96641 Presence of right artificial hip joint: Secondary | ICD-10-CM

## 2023-05-19 NOTE — Progress Notes (Signed)
The patient is now well over 2 months status post a right total hip arthroplasty.  He is an active and young appearing 67 year old gentleman.  He is doing great overall.  He has hit at a driving range but not played regular golf yet.  He walks with a normal-appearing gait.  His leg lengths are equal.  He tolerates me easily putting his right hip through full range of motion with no difficulty at all.  From my standpoint I will see him back in 6 months.  Will have a standing AP pelvis and lateral of his right operative hip.  If there are issues before then he knows to let us know.

## 2023-06-17 ENCOUNTER — Ambulatory Visit (INDEPENDENT_AMBULATORY_CARE_PROVIDER_SITE_OTHER): Payer: Medicare (Managed Care) | Admitting: Family Medicine

## 2023-06-17 ENCOUNTER — Ambulatory Visit (HOSPITAL_BASED_OUTPATIENT_CLINIC_OR_DEPARTMENT_OTHER)
Admission: RE | Admit: 2023-06-17 | Discharge: 2023-06-17 | Disposition: A | Discharge status: Home / Self Care | Payer: Medicare (Managed Care) | Source: Ambulatory Visit | Attending: Family Medicine | Admitting: Family Medicine

## 2023-06-17 ENCOUNTER — Encounter: Payer: Self-pay | Admitting: Family Medicine

## 2023-06-17 VITALS — BP 132/88 | HR 96 | Temp 98.3°F | Resp 18 | Ht 69.0 in | Wt 201.4 lb

## 2023-06-17 DIAGNOSIS — M799 Soft tissue disorder, unspecified: Secondary | ICD-10-CM | POA: Diagnosis not present

## 2023-06-17 DIAGNOSIS — L0390 Cellulitis, unspecified: Secondary | ICD-10-CM

## 2023-06-17 DIAGNOSIS — M25521 Pain in right elbow: Secondary | ICD-10-CM

## 2023-06-17 MED ORDER — DOXYCYCLINE HYCLATE 100 MG PO TABS
100.0000 mg | ORAL_TABLET | Freq: Two times a day (BID) | ORAL | 0 refills | Status: DC
Start: 2023-06-17 — End: 2024-08-25

## 2023-06-17 NOTE — Patient Instructions (Signed)

## 2023-06-17 NOTE — Progress Notes (Signed)
Established Patient Office Visit  Subjective   Patient ID: Thomas Rosario, male    DOB: 03/14/1956  Age: 67 y.o. MRN: [id number]  Chief Complaint  Patient presents with   Elbow Pain    Right elbow, sxs started Sunday. Pt banged elbow on ladder. Last Tdap on 11/24/2019   Anemia    HPI Discussed the use of AI scribe software for clinical note transcription with the patient, who gave verbal consent to proceed.  History of Present Illness   The patient, a nurse, presents with a painful, swollen, and hot right elbow. The symptoms began after he hit his elbow hard on a ladder while installing a grille on his bathroom ceiling. The initial pain, which radiated up his arm, was dismissed as hitting his 'funny bone.' However, the pain and swelling have progressively worsened each day since the incident. Last night, the pain was particularly severe. He has been managing the pain with ibuprofen and icing the elbow.      Patient Active Problem List   Diagnosis Date Noted   Status post total replacement of right hip 03/07/2023   Elevated coronary artery calcium score 10/22/2022   Ascending aorta dilatation (HCC) 10/22/2022   Prediabetes 10/21/2022   Postnasal drip 01/05/2019   HTN (hypertension) 12/22/2018   HLD (hyperlipidemia) 12/22/2018   Family history of heart attack 12/22/2018   Allergic rhinitis 02/01/2016   BCC (basal cell carcinoma of skin) 01/29/2012   Past Medical History:  Diagnosis Date   Arthritis    BCC (basal cell carcinoma of skin) 01/29/2012   HLD (hyperlipidemia) 12/22/2018   HTN (hypertension) 12/22/2018   Past Surgical History:  Procedure Laterality Date   TOTAL HIP ARTHROPLASTY Right 03/07/2023   Procedure: RIGHT TOTAL HIP ARTHROPLASTY ANTERIOR APPROACH;  Surgeon: Kathryne Hitch, MD;  Location: [facility];  Service: Orthopedics;  Laterality: Right;   Social History   Tobacco Use   Smoking status: Never   Smokeless tobacco: Never  Vaping Use   Vaping  status: Never Used  Substance Use Topics   Alcohol use: Yes    Alcohol/week: 6.0 standard drinks of alcohol    Types: 6 Glasses of wine per week   Drug use: Never   Social History   Socioeconomic History   Marital status: Married    Spouse name: Not on file   Number of children: Not on file   Years of education: Not on file   Highest education level: Not on file  Occupational History   Not on file  Tobacco Use   Smoking status: Never   Smokeless tobacco: Never  Vaping Use   Vaping status: Never Used  Substance and Sexual Activity   Alcohol use: Yes    Alcohol/week: 6.0 standard drinks of alcohol    Types: 6 Glasses of wine per week   Drug use: Never   Sexual activity: Not on file  Other Topics Concern   Not on file  Social History Narrative   Not on file   Social Determinants of Health   Financial Resource Strain: Not on file  Food Insecurity: Patient Declined (03/07/2023)   Hunger Vital Sign    Worried About Running Out of Food in the Last Year: Patient declined    Ran Out of Food in the Last Year: Patient declined  Transportation Needs: No Transportation Needs (03/07/2023)   PRAPARE - Administrator, Civil Service (Medical): No    Lack of Transportation (Non-Medical): No  Physical Activity: Not  on file  Stress: Not on file  Social Connections: Unknown (04/02/2022)   [facility]   Social Network    Social Network: Not on file  Intimate Partner Violence: Not At Risk (03/07/2023)   Humiliation, Afraid, Rape, and Kick questionnaire    Fear of Current or Ex-Partner: No    Emotionally Abused: No    Physically Abused: No    Sexually Abused: No   Family Status  Relation Name Status   Father  Deceased  No partnership data on file   Family History  Problem Relation Age of Onset   Early death Father    Heart attack Father    No Known Allergies    Review of Systems  Constitutional:  Negative for fever and  malaise/fatigue.  HENT:  Negative for congestion.   Eyes:  Negative for blurred vision.  Respiratory:  Negative for shortness of breath.   Cardiovascular:  Negative for chest pain, palpitations and leg swelling.  Gastrointestinal:  Negative for abdominal pain, blood in stool and nausea.  Genitourinary:  Negative for dysuria and frequency.  Musculoskeletal:  Positive for joint pain. Negative for falls.  Skin:  Negative for rash.  Neurological:  Negative for dizziness, loss of consciousness and headaches.  Endo/Heme/Allergies:  Negative for environmental allergies.  Psychiatric/Behavioral:  Negative for depression. The patient is not nervous/anxious.       Objective:     BP 132/88 (BP Location: Left Arm, Patient Position: Sitting, Cuff Size: Normal)   Pulse 96   Temp 98.3 F (36.8 C) (Oral)   Resp 18   Ht 5\' 9"  (1.753 m)   Wt 201 lb 6.4 oz (91.4 kg)   SpO2 97%   BMI 29.74 kg/m  BP Readings from Last 3 Encounters:  06/17/23 132/88  04/23/23 132/78  03/08/23 (!) 137/95   Wt Readings from Last 3 Encounters:  06/17/23 201 lb 6.4 oz (91.4 kg)  04/23/23 200 lb 3.2 oz (90.8 kg)  03/07/23 197 lb (89.4 kg)   SpO2 Readings from Last 3 Encounters:  06/17/23 97%  04/23/23 97%  03/08/23 99%      Physical Exam Vitals and nursing note reviewed.  Constitutional:      General: He is not in acute distress.    Appearance: Normal appearance. He is well-developed.  HENT:     Head: Normocephalic and atraumatic.  Eyes:     General: No scleral icterus.       Right eye: No discharge.        Left eye: No discharge.  Cardiovascular:     Rate and Rhythm: Normal rate and regular rhythm.     Heart sounds: No murmur heard. Pulmonary:     Effort: Pulmonary effort is normal. No respiratory distress.     Breath sounds: Normal breath sounds.  Musculoskeletal:        General: Swelling and tenderness present. Normal range of motion.     Cervical back: Normal range of motion and neck supple.      Right lower leg: No edema.     Left lower leg: No edema.  Skin:    General: Skin is warm and dry.     Findings: Erythema present.  Neurological:     General: No focal deficit present.     Mental Status: He is alert and oriented to person, place, and time.  Psychiatric:        Mood and Affect: Mood normal.  Behavior: Behavior normal.        Thought Content: Thought content normal.        Judgment: Judgment normal.      No results found for any visits on 06/17/23.  Last CBC Lab Results  Component Value Date   WBC 5.2 04/23/2023   HGB 14.9 04/23/2023   HCT 44.7 04/23/2023   MCV 91.3 04/23/2023   MCH 31.7 03/08/2023   RDW 13.3 04/23/2023   PLT 276.0 04/23/2023   Last metabolic panel Lab Results  Component Value Date   GLUCOSE 95 04/23/2023   NA 142 04/23/2023   K 4.2 04/23/2023   CL 106 04/23/2023   CO2 29 04/23/2023   BUN 14 04/23/2023   CREATININE 0.95 04/23/2023   GFR 83.66 04/23/2023   CALCIUM 9.4 04/23/2023   PROT 6.4 04/23/2023   ALBUMIN 4.0 04/23/2023   BILITOT 0.8 04/23/2023   ALKPHOS 77 04/23/2023   AST 18 04/23/2023   ALT 25 04/23/2023   ANIONGAP 7 03/08/2023   Last lipids Lab Results  Component Value Date   CHOL 158 04/23/2023   HDL 46.00 04/23/2023   LDLCALC 92 04/23/2023   TRIG 97.0 04/23/2023   CHOLHDL 3 04/23/2023   Last hemoglobin A1c Lab Results  Component Value Date   HGBA1C 5.7 (H) 02/26/2023   Last thyroid functions No results found for: "TSH", "T3TOTAL", "T4TOTAL", "THYROIDAB" Last vitamin D No results found for: "25OHVITD2", "25OHVITD3", "VD25OH" Last vitamin B12 and Folate No results found for: "VITAMINB12", "FOLATE"    The 10-year ASCVD risk score (Arnett DK, et al., 2019) is: 12.9%    Assessment & Plan:   Problem List Items Addressed This Visit   None Visit Diagnoses     Cellulitis, unspecified cellulitis site    -  Primary   Relevant Medications   doxycycline (VIBRA-TABS) 100 MG tablet   Other  Relevant Orders   DG Elbow Complete Right   Right elbow pain       Relevant Orders   DG Elbow Complete Right     Assessment and Plan    Right Elbow Trauma: Sustained a hard hit to the right elbow after falling from a ladder. The elbow is hot to touch and painful, suggestive of an infection. -Order an X-ray to rule out any bone injury. -Prescribe Doxycycline for the suspected infection. -Follow-up next week or earlier if symptoms worsen.        Return if symptoms worsen or fail to improve, for next week with dr copland .    Donato Schultz, DO

## 2023-06-20 ENCOUNTER — Encounter: Payer: Self-pay | Admitting: Pharmacist

## 2023-06-20 NOTE — Progress Notes (Signed)
Pharmacy Quality Measure Review  This patient is appearing on a report for being at risk of failing the adherence measure for cholesterol (statin) medications this calendar year.   Medication: rosuvastatin  Last fill date: 02/15/2023 for 90 day supply per [facility] report   Reviewed Dr Tiajuana Amass refill history and patient filled rosuvastatin 05/28/2023 for 90 DS at [facility] - verified was picked up 05/31/2023 PDC = 87%

## 2023-07-02 ENCOUNTER — Ambulatory Visit: Payer: Medicare (Managed Care) | Admitting: Family Medicine

## 2023-07-02 ENCOUNTER — Telehealth: Payer: Medicare (Managed Care) | Admitting: Family Medicine

## 2023-11-03 ENCOUNTER — Ambulatory Visit (INDEPENDENT_AMBULATORY_CARE_PROVIDER_SITE_OTHER): Payer: Medicare (Managed Care) | Admitting: Orthopaedic Surgery

## 2023-11-03 ENCOUNTER — Other Ambulatory Visit (INDEPENDENT_AMBULATORY_CARE_PROVIDER_SITE_OTHER): Payer: Self-pay

## 2023-11-03 ENCOUNTER — Encounter: Payer: Self-pay | Admitting: Orthopaedic Surgery

## 2023-11-03 DIAGNOSIS — Z96641 Presence of right artificial hip joint: Secondary | ICD-10-CM | POA: Diagnosis not present

## 2023-11-03 NOTE — Progress Notes (Signed)
The patient is right at 8 months status post a right total hip arthroplasty.  He is only 67 years old and active.  He would like to jog from time to time.  He says he can still feel like he has a hip replacement but overall is doing well.  He reports good range of motion and strength.  On exam his leg lengths appear equal.  I can put his right operative hip easily through internal and external rotation and is equal to the other side.  There is just a small amount of pain over the trochanteric area but overall he looks good.  An AP pelvis and lateral the right hip shows a well-seated bone ingrown total hip arthroplasty with no complicating features.  At this point follow-up can be as needed.  He has really no restrictions.  I do not want him to be a daily runner but he can jog on occasion.  If he has any issues with that hip at all he knows to reach out to [place].  All questions and concerns were addressed and answered.

## 2023-12-31 DIAGNOSIS — M19032 Primary osteoarthritis, left wrist: Secondary | ICD-10-CM | POA: Diagnosis not present

## 2024-01-07 ENCOUNTER — Other Ambulatory Visit: Payer: Self-pay | Admitting: Family Medicine

## 2024-01-07 DIAGNOSIS — R931 Abnormal findings on diagnostic imaging of heart and coronary circulation: Secondary | ICD-10-CM

## 2024-03-31 ENCOUNTER — Encounter: Payer: Self-pay | Admitting: Family Medicine

## 2024-03-31 DIAGNOSIS — D225 Melanocytic nevi of trunk: Secondary | ICD-10-CM | POA: Diagnosis not present

## 2024-03-31 DIAGNOSIS — L821 Other seborrheic keratosis: Secondary | ICD-10-CM | POA: Diagnosis not present

## 2024-03-31 DIAGNOSIS — D2270 Melanocytic nevi of unspecified lower limb, including hip: Secondary | ICD-10-CM | POA: Diagnosis not present

## 2024-03-31 DIAGNOSIS — Z85828 Personal history of other malignant neoplasm of skin: Secondary | ICD-10-CM | POA: Diagnosis not present

## 2024-03-31 DIAGNOSIS — N486 Induration penis plastica: Secondary | ICD-10-CM

## 2024-03-31 DIAGNOSIS — D2260 Melanocytic nevi of unspecified upper limb, including shoulder: Secondary | ICD-10-CM | POA: Diagnosis not present

## 2024-03-31 NOTE — Addendum Note (Signed)
 Addended by: Gates Kasal C on: 03/31/2024 02:14 PM   Modules accepted: Orders

## 2024-04-02 ENCOUNTER — Encounter: Payer: Self-pay | Admitting: Family Medicine

## 2024-04-02 ENCOUNTER — Other Ambulatory Visit: Payer: Self-pay | Admitting: Family Medicine

## 2024-04-02 DIAGNOSIS — N486 Induration penis plastica: Secondary | ICD-10-CM

## 2024-04-02 NOTE — Progress Notes (Signed)
u

## 2024-05-04 ENCOUNTER — Ambulatory Visit: Payer: Medicare (Managed Care)

## 2024-06-15 DIAGNOSIS — M19032 Primary osteoarthritis, left wrist: Secondary | ICD-10-CM | POA: Diagnosis not present

## 2024-08-05 ENCOUNTER — Ambulatory Visit: Payer: Medicare (Managed Care)

## 2024-08-25 ENCOUNTER — Encounter: Payer: Self-pay | Admitting: Family Medicine

## 2024-08-25 ENCOUNTER — Telehealth (INDEPENDENT_AMBULATORY_CARE_PROVIDER_SITE_OTHER): Payer: Medicare (Managed Care) | Admitting: Family Medicine

## 2024-08-25 DIAGNOSIS — J069 Acute upper respiratory infection, unspecified: Secondary | ICD-10-CM

## 2024-08-25 MED ORDER — BENZONATATE 200 MG PO CAPS: 200.0000 mg | ORAL_CAPSULE | Freq: Two times a day (BID) | ORAL | 0 refills | Status: AC | PRN

## 2024-08-25 MED ORDER — PROMETHAZINE-DM 6.25-15 MG/5ML PO SYRP: 5.0000 mL | ORAL_SOLUTION | Freq: Every evening | ORAL | 0 refills | Status: DC | PRN

## 2024-08-25 NOTE — Progress Notes (Signed)
 CC: URI  Thomas Rosario here for URI complaints. We are interacting via web portal for an electronic face-to-face visit. I verified patient's ID using 2 identifiers. Patient agreed to proceed with visit via this method. Patient is in a parked car, I am at office. Patient and I are present for visit.   Duration: 4 days  Associated symptoms: sinus congestion, sinus pain, rhinorrhea, sneezing, and dry cough Denies: itchy watery eyes, ear pain, ear drainage, sore throat, wheezing, shortness of breath, myalgia, and fevers Treatment to date: Tylenol  Cold/Flu, throat lozenges Sick contacts: Yes- granddaughter  Past Medical History:  Diagnosis Date   Arthritis    BCC (basal cell carcinoma of skin) 01/29/2012   HLD (hyperlipidemia) 12/22/2018   HTN (hypertension) 12/22/2018    Objective No conversational dyspnea Age appropriate judgment and insight Nml affect and mood  Viral URI with cough - Plan: benzonatate  (TESSALON ) 200 MG capsule, promethazine-dextromethorphan (PROMETHAZINE-DM) 6.25-15 MG/5ML syrup  Tessalon  Perles during the day. Syrup at night. Warned about drowsiness. Send message in 2 d if no better. Continue to push fluids, practice good hand hygiene, cover mouth when coughing. F/u prn. If starting to experience fevers, shaking, or shortness of breath, seek immediate care. Pt voiced understanding and agreement to the plan.  Mabel Mt [place], DO 08/25/24 1:46 PM

## 2024-09-27 ENCOUNTER — Encounter: Payer: Self-pay | Admitting: Radiology

## 2024-10-08 ENCOUNTER — Encounter: Payer: Self-pay | Admitting: Sports Medicine

## 2024-10-08 ENCOUNTER — Telehealth (INDEPENDENT_AMBULATORY_CARE_PROVIDER_SITE_OTHER): Payer: Medicare (Managed Care) | Admitting: Sports Medicine

## 2024-10-08 ENCOUNTER — Ambulatory Visit: Payer: Self-pay

## 2024-10-08 VITALS — Ht 69.0 in | Wt 201.0 lb

## 2024-10-08 DIAGNOSIS — J069 Acute upper respiratory infection, unspecified: Secondary | ICD-10-CM

## 2024-10-08 MED ORDER — BENZONATATE 200 MG PO CAPS: 200.0000 mg | ORAL_CAPSULE | Freq: Two times a day (BID) | ORAL | 0 refills | Status: AC | PRN

## 2024-10-08 MED ORDER — PROMETHAZINE-DM 6.25-15 MG/5ML PO SYRP: 5.0000 mL | ORAL_SOLUTION | Freq: Every evening | ORAL | 0 refills | Status: AC | PRN

## 2024-10-08 NOTE — Telephone Encounter (Signed)
  FYI Only or Action Required?: FYI only for provider: appointment scheduled on 10/08/24.  Patient was last seen in primary care on 08/25/2024 by Frann Mabel Mt, DO.  Called Nurse Triage reporting Cough.  Symptoms began several days ago.  Interventions attempted: Prescription medications: promethazine.  Symptoms are: unchanged.  Triage Disposition: See Physician Within 24 Hours  Patient/caregiver understands and will follow disposition?: Yes          Copied from CRM #[id number]. Topic: Clinical - Red Word Triage >> Oct 08, 2024 11:21 AM [facility] ORN wrote: Red Word that prompted transfer to Nurse Triage: congestion, coughing. neg covid test. Pt wants virtual visit. Sx began 2 days ago promethazine Reason for Disposition  [1] Continuous (nonstop) coughing interferes with work or school AND [2] no improvement using cough treatment per Care Advice  Answer Assessment - Initial Assessment Questions 1. ONSET: When did the cough begin?      A few  2. SEVERITY: How bad is the cough today?      *No Answer* 3. SPUTUM: Describe the color of your sputum (e.g., none, dry cough; clear, white, yellow, green)     Dry cough 4. HEMOPTYSIS: Are you coughing up any blood? If Yes, ask: How much? (e.g., flecks, streaks, tablespoons, etc.)     denies 5. DIFFICULTY BREATHING: Are you having difficulty breathing? If Yes, ask: How bad is it? (e.g., mild, moderate, severe)      denies 6. FEVER: Do you have a fever? If Yes, ask: What is your temperature, how was it measured, and when did it start?     denies 7. CARDIAC HISTORY: Do you have any history of heart disease? (e.g., heart attack, congestive heart failure)      denies 8. LUNG HISTORY: Do you have any history of lung disease?  (e.g., pulmonary embolus, asthma, emphysema)     denies 9. PE RISK FACTORS: Do you have a history of blood clots? (or: recent major surgery, recent prolonged travel, bedridden)      denies 10. OTHER SYMPTOMS: Do you have any other symptoms? (e.g., runny nose, wheezing, chest pain)       Congestion. Denies others 11. PREGNANCY: Is there any chance you are pregnant? When was your last menstrual period?       N/a 12. TRAVEL: Have you traveled out of the country in the last month? (e.g., travel history, exposures)       N/a Endorses home COVID negative  Protocols used: Cough - Acute Productive-A-AH

## 2024-10-08 NOTE — Progress Notes (Signed)
 Virtual Visit via Video Note  I connected with Thomas Rosario on 10/08/24 at  1:40 PM EST by a video enabled telemedicine application and verified that I am speaking with the correct person using two identifiers.  Start time 1.40 End time 1.47  Location: Patient: Thomas Rosario Provider: Dr Sherlynn   I discussed the limitations of evaluation and management by telemedicine and the availability of in person appointments. The patient expressed understanding and agreed to proceed.  History of Present Illness:   Thomas Rosario is a 68 year old male who presents with cough and congestion.  He has been experiencing cough and congestion for an unspecified duration. There is no phlegm production with the cough, and he has not had any fever. No sore throat, ear pain, ear discharge, nasal discharge, or sinus pain. Denies chest pain, sob, abdominal pain, nausea, vomiting, dysuria, hematuria.  No history of asthma, COPD, or smoking. No wheezing, and he has not been in contact with anyone with upper respiratory infections recently.  A COVID test was performed and returned negative.    Observations/Objective: Pt does not appear to be in distress Able to speak in full sentences   Assessment and Plan: Assessment & Plan Viral URI with cough Pt c/o cough since few days Afebrile Denies sob  Tried promethazine- dm and requested for a refill and a day time cough syrup  Orders:   benzonatate  (TESSALON ) 200 MG capsule; Take 1 capsule (200 mg total) by mouth 2 (two) times daily as needed for cough.   promethazine-dextromethorphan (PROMETHAZINE-DM) 6.25-15 MG/5ML syrup; Take 5 mLs by mouth at bedtime as needed for cough.     Follow Up Instructions:    I discussed the assessment and treatment plan with the patient. The patient was provided an opportunity to ask questions and all were answered. The patient agreed with the plan and demonstrated an understanding of the instructions.   The patient  was advised to call back or seek an in-person evaluation if the symptoms worsen or if the condition fails to improve as anticipated.  I provided  7 minutes of non-face-to-face time during this encounter.   Jackalyn Sherlynn, MD
